# Patient Record
Sex: Female | Born: 1970 | Race: White | Hispanic: No | Marital: Single | Smoking: Never smoker
Health system: Southern US, Community
[De-identification: ages and names within clinical notes are randomized; demographics above are authoritative.]

## PROBLEM LIST (undated history)

## (undated) DIAGNOSIS — Z87442 Personal history of urinary calculi: Secondary | ICD-10-CM

## (undated) DIAGNOSIS — M1990 Unspecified osteoarthritis, unspecified site: Secondary | ICD-10-CM

## (undated) DIAGNOSIS — J45909 Unspecified asthma, uncomplicated: Secondary | ICD-10-CM

## (undated) DIAGNOSIS — Z8669 Personal history of other diseases of the nervous system and sense organs: Secondary | ICD-10-CM

## (undated) DIAGNOSIS — F32A Depression, unspecified: Secondary | ICD-10-CM

## (undated) DIAGNOSIS — R519 Headache, unspecified: Secondary | ICD-10-CM

## (undated) DIAGNOSIS — I10 Essential (primary) hypertension: Secondary | ICD-10-CM

## (undated) DIAGNOSIS — F419 Anxiety disorder, unspecified: Secondary | ICD-10-CM

## (undated) DIAGNOSIS — E039 Hypothyroidism, unspecified: Secondary | ICD-10-CM

## (undated) DIAGNOSIS — C541 Malignant neoplasm of endometrium: Secondary | ICD-10-CM

## (undated) DIAGNOSIS — E119 Type 2 diabetes mellitus without complications: Secondary | ICD-10-CM

## (undated) DIAGNOSIS — L309 Dermatitis, unspecified: Secondary | ICD-10-CM

## (undated) DIAGNOSIS — G4733 Obstructive sleep apnea (adult) (pediatric): Secondary | ICD-10-CM

## (undated) HISTORY — PX: HAND SURGERY: SHX662

## (undated) HISTORY — DX: Obstructive sleep apnea (adult) (pediatric): G47.33

## (undated) HISTORY — PX: URETEROSCOPY WITH HOLMIUM LASER LITHOTRIPSY: SHX6645

## (undated) HISTORY — DX: Dermatitis, unspecified: L30.9

## (undated) HISTORY — PX: FOOT SURGERY: SHX648

## (undated) HISTORY — DX: Type 2 diabetes mellitus without complications: E11.9

## (undated) HISTORY — DX: Malignant neoplasm of endometrium: C54.1

---

## 1997-08-14 ENCOUNTER — Ambulatory Visit: Admission: RE | Admit: 1997-08-14 | Discharge: 1997-08-14 | Payer: Self-pay | Admitting: Family Medicine

## 1999-01-14 HISTORY — PX: LITHOTRIPSY: SUR834

## 1999-12-18 ENCOUNTER — Encounter: Payer: Self-pay | Admitting: Family Medicine

## 1999-12-18 ENCOUNTER — Ambulatory Visit (HOSPITAL_COMMUNITY): Admission: RE | Admit: 1999-12-18 | Discharge: 1999-12-18 | Payer: Self-pay | Admitting: *Deleted

## 1999-12-19 ENCOUNTER — Encounter: Payer: Self-pay | Admitting: Urology

## 1999-12-19 ENCOUNTER — Ambulatory Visit (HOSPITAL_COMMUNITY): Admission: RE | Admit: 1999-12-19 | Discharge: 1999-12-19 | Payer: Self-pay | Admitting: Urology

## 2000-01-14 HISTORY — PX: LASIK: SHX215

## 2000-01-14 HISTORY — PX: WISDOM TOOTH EXTRACTION: SHX21

## 2001-12-06 ENCOUNTER — Encounter: Payer: Self-pay | Admitting: Urology

## 2001-12-06 ENCOUNTER — Encounter: Admission: RE | Admit: 2001-12-06 | Discharge: 2001-12-06 | Payer: Self-pay | Admitting: Urology

## 2004-02-15 ENCOUNTER — Other Ambulatory Visit: Admission: RE | Admit: 2004-02-15 | Discharge: 2004-02-15 | Payer: Self-pay | Admitting: Obstetrics and Gynecology

## 2005-03-09 ENCOUNTER — Emergency Department (HOSPITAL_COMMUNITY): Admission: EM | Admit: 2005-03-09 | Discharge: 2005-03-10 | Payer: Self-pay | Admitting: Emergency Medicine

## 2005-10-30 ENCOUNTER — Ambulatory Visit (HOSPITAL_BASED_OUTPATIENT_CLINIC_OR_DEPARTMENT_OTHER): Admission: RE | Admit: 2005-10-30 | Discharge: 2005-10-30 | Payer: Self-pay | Admitting: Urology

## 2005-11-20 ENCOUNTER — Ambulatory Visit (HOSPITAL_BASED_OUTPATIENT_CLINIC_OR_DEPARTMENT_OTHER): Admission: RE | Admit: 2005-11-20 | Discharge: 2005-11-20 | Payer: Self-pay | Admitting: Urology

## 2007-09-20 ENCOUNTER — Emergency Department (HOSPITAL_COMMUNITY): Admission: EM | Admit: 2007-09-20 | Discharge: 2007-09-20 | Payer: Self-pay | Admitting: Emergency Medicine

## 2009-02-07 ENCOUNTER — Encounter: Admission: RE | Admit: 2009-02-07 | Discharge: 2009-02-07 | Payer: Self-pay | Admitting: Obstetrics and Gynecology

## 2009-08-10 ENCOUNTER — Emergency Department (HOSPITAL_COMMUNITY): Admission: EM | Admit: 2009-08-10 | Discharge: 2009-08-10 | Payer: Self-pay | Admitting: Emergency Medicine

## 2009-11-08 ENCOUNTER — Emergency Department (HOSPITAL_COMMUNITY): Admission: EM | Admit: 2009-11-08 | Discharge: 2009-11-09 | Payer: Self-pay | Admitting: Emergency Medicine

## 2010-02-03 ENCOUNTER — Encounter: Payer: Self-pay | Admitting: Urology

## 2010-03-27 LAB — CBC
HCT: 44.7 % (ref 36.0–46.0)
MCH: 30.2 pg (ref 26.0–34.0)
MCV: 90.5 fL (ref 78.0–100.0)
Platelets: 287 10*3/uL (ref 150–400)
RBC: 4.94 MIL/uL (ref 3.87–5.11)
WBC: 13.3 10*3/uL — ABNORMAL HIGH (ref 4.0–10.5)

## 2010-03-27 LAB — COMPREHENSIVE METABOLIC PANEL
AST: 30 U/L (ref 0–37)
Albumin: 3.9 g/dL (ref 3.5–5.2)
Calcium: 9.1 mg/dL (ref 8.4–10.5)
Creatinine, Ser: 0.94 mg/dL (ref 0.4–1.2)
GFR calc non Af Amer: >60 mL/min (ref >60)

## 2010-03-27 LAB — URINALYSIS, ROUTINE W REFLEX MICROSCOPIC
Glucose, UA: NEGATIVE mg/dL
Nitrite: NEGATIVE
Protein, ur: 30 mg/dL — AB
Specific Gravity, Urine: 1.022 (ref 1.005–1.030)

## 2010-03-27 LAB — DIFFERENTIAL
Lymphocytes Relative: 13 % (ref 12–46)
Lymphs Abs: 1.7 10*3/uL (ref 0.7–4.0)
Monocytes Absolute: 0.9 10*3/uL (ref 0.1–1.0)
Monocytes Relative: 7 % (ref 3–12)
Neutro Abs: 10.2 10*3/uL — ABNORMAL HIGH (ref 1.7–7.7)

## 2010-03-27 LAB — LIPASE, BLOOD: Lipase: 24 U/L (ref 11–59)

## 2010-03-27 LAB — URINE MICROSCOPIC-ADD ON

## 2010-03-27 LAB — POCT PREGNANCY, URINE: Preg Test, Ur: NEGATIVE

## 2010-03-30 LAB — URINE MICROSCOPIC-ADD ON

## 2010-03-30 LAB — URINALYSIS, ROUTINE W REFLEX MICROSCOPIC
Protein, ur: NEGATIVE mg/dL
Urobilinogen, UA: 0.2 mg/dL (ref 0.0–1.0)

## 2010-03-30 LAB — POCT I-STAT, CHEM 8
Calcium, Ion: 1.05 mmol/L — ABNORMAL LOW (ref 1.12–1.32)
Creatinine, Ser: 0.8 mg/dL (ref 0.4–1.2)
Glucose, Bld: 165 mg/dL — ABNORMAL HIGH (ref 70–99)
HCT: 45 % (ref 36.0–46.0)
Hemoglobin: 15.3 g/dL — ABNORMAL HIGH (ref 12.0–15.0)
Potassium: 4.1 mEq/L (ref 3.5–5.1)

## 2010-03-30 LAB — DIFFERENTIAL
Eosinophils Absolute: 0.3 10*3/uL (ref 0.0–0.7)
Eosinophils Relative: 3 % (ref 0–5)
Lymphs Abs: 1.8 10*3/uL (ref 0.7–4.0)
Monocytes Relative: 7 % (ref 3–12)
Neutro Abs: 7.3 10*3/uL (ref 1.7–7.7)

## 2010-03-30 LAB — CBC
HCT: 42.9 % (ref 36.0–46.0)
Hemoglobin: 14.6 g/dL (ref 12.0–15.0)
MCH: 31.2 pg (ref 26.0–34.0)
MCHC: 33.9 g/dL (ref 30.0–36.0)
MCV: 91.9 fL (ref 78.0–100.0)
RDW: 13.6 % (ref 11.5–15.5)

## 2010-05-31 NOTE — Op Note (Signed)
Amanda Spence, WHAN NO.:  [id number]   MEDICAL RECORD NO.:  [id number]          PATIENT TYPE:  AMB   LOCATION:  [facility]                         FACILITY:  [facility]   PHYSICIAN:  Claudette Laws, M.D.  DATE OF BIRTH:  Jan 13, 1971   DATE OF PROCEDURE:  DATE OF DISCHARGE:                                 OPERATIVE REPORT   PREOPERATIVE DIAGNOSIS:  Right ureteral stone.   POSTOPERATIVE DIAGNOSIS:  Right ureteral stone.   PROCEDURE:  Cystoscopy with right ureteroscopic stone manipulation.   SURGEON:  Claudette Laws, M.D.   ASSISTANT:  Terie Purser, M.D.   ANESTHESIA:  General endotracheal   COMPLICATIONS:  None.   ESTIMATED BLOOD LOSS:  Minimal.   DISPOSITION:  Stable to PACU.   INDICATION FOR PROCEDURE:  Ms. Redel is a 40 year old female with history  of kidney stones.  She recently underwent right ureteroscopic stone  manipulation.  She had a significant stone burden in her distal ureter and  was not able to be completed in one setting.  She had a stent in place and  follows up today for the second procedure.  She has been doing well since  her previous surgery.  Full benefits and risks were discussed and consent  was obtained.   DESCRIPTION OF PROCEDURE:  The patient was brought to the operating room and  properly identified.  A time-out was performed to confirm the correct  patient, procedure, and side.  She was administered general anesthesia,  given preoperative antibiotics, placed in the dorsal lithotomy position, and  prepped and draped in a sterile fashion.   We then performed cystoscopy using 12 and 70 degree lens and 22-French  sheath.  There were no mucosal abnormalities or tumors noted in the bladder.  There was a stent emanating from the right UO.  The stent was grasped with  flexible graspers and brought to the level of the meatus.  A tension wire  was passed over the stent to the level of the renal pelvis confirmed with  fluoroscopy.  The  stent was removed.  We then placed a semi-rigid  ureteroscope in the right ureter.  Three significant stones were encountered  in the distal ureter.  These were unable to be removed with a triceps  grasper and were then fragmented using the holmium laser at settings of 0.6  and 0.8.  The stones were broken into multiple small fragments.  These were  then removed with the tricep graspers.  When this was completed, the semi-  rigid ureteroscope was advanced to the level of the renal pelvis.  There was  no significant mucosal abnormality or other significant stone noted in the  ureter.  The scope was then removed and a 6-French 24 cm double-J ureteral  stent was placed using cystoscopic and fluoroscopic guidance.  The bladder  was then emptied  and stone fragments were removed from the bladder.  The patient was then  awakened from anesthesia and transported to the recovery room in stable  condition.  There were no complications.  Dr. Etta Grandchild was present and  participated in all aspects  of this procedure.     ______________________________  Terie Purser, MD      Claudette Laws, M.D.  Electronically Signed    JH/MEDQ  D:  11/20/2005  T:  11/20/2005  Job:  [id number]

## 2010-05-31 NOTE — Op Note (Signed)
[name], Amanda NO.:  [id number]   MEDICAL RECORD NO.:  [id number]          PATIENT TYPE:  AMB   LOCATION:  [facility]                         FACILITY:  [facility]   PHYSICIAN:  Claudette Laws, M.D.  DATE OF BIRTH:  12-03-1970   DATE OF PROCEDURE:  10/30/2005  DATE OF DISCHARGE:                                 OPERATIVE REPORT   PREOPERATIVE DIAGNOSIS:  Right ureteral stones.   POSTOPERATIVE DIAGNOSIS:  Right ureteral stones.   PROCEDURE:  1. Cystoscopy.  2. Staged right ureteroscopic stone manipulation.   SURGEON:  Claudette Laws, M.D.   ASSISTANT:  Terie Purser, M.D.   ANESTHESIA:  General endotracheal.   SPECIMENS:  Stones fragments for analysis.   ESTIMATED BLOOD LOSS:  Minimal.   COMPLICATIONS:  None.   DRAINS:  6-French 26 cm double-J ureteral stent.   DISPOSITION:  To the PACU in stable condition.   INDICATIONS:  [name] is a 40 year old female with a history of kidney  stones.  She has had symptomatic right pain.  She has had imaging which  shows distal right ureteral stones.  There are approximately three stones in  the distal right ureter measuring approximately 0.5 cm to approximately 1  cm.  The benefits and risks of ureteroscopic stone manipulation were  discussed and consent was obtained.   DESCRIPTION OF PROCEDURE:  The patient was brought to the operating room and  properly identified.  A time-out was performed to confirm correct patient  procedure and side.  She was administered general anesthesia, given  preoperative antibiotics, placed in the dorsal lithotomy position, prepped  and draped in a sterile fashion.  Cystoscopy was easily performed using 12  and 70 degree lens and 22-French cystoscope sheath.  There was no evidence  of a stone in the bladder.  Both ureteral orifices were in their normal  anatomic position.  Both seen to be effluxing clear urine.  There was no  evidence of bladder tumor or other mucosal abnormalities.   We then  manipulated a Glidewire in the right ureteral orifice to the level of the  kidney.  The cystoscope was removed and the semi-rigid ureteroscope was  advanced in the ureter.  We encountered the three stones in the distal third  of the ureter.  We then performed stone manipulation using the laser at  settings of 0.6 and 0.8. The lead stone was fragmented into multiple small  pieces.  The stone was quite hard and fragmenting took a fair amount of  time.  We then pulled the stone fragments out of the ureter using a triceps  grasper.  Due to time constraints and the duration of the procedure, the  decision was made to terminate prior to fragmenting the upper two stones.  As a result, a 6-French 26 cm double-J ureteral stent was placed under  cystoscopic and fluoroscopic guidance.  A good curl was noted in the pelvis  and the kidney.  The stone fragments were evacuated from the bladder and  sent to pathology for analysis.  The patient's bladder was then emptied and  the cystoscope was  removed.  The procedure was terminated.  There were no  complications.  Note that Dr. Etta Grandchild was present and participated in all  aspects of this procedure.   DISPOSITION:  The patient will return in approximately two weeks for a  second stage procedure for removal of her remaining stones.     ______________________________  Terie Purser, MD      Claudette Laws, M.D.  Electronically Signed    JH/MEDQ  D:  10/30/2005  T:  11/01/2005  Job:  [id number]

## 2010-10-16 LAB — URINALYSIS, ROUTINE W REFLEX MICROSCOPIC
Bilirubin Urine: NEGATIVE
Glucose, UA: NEGATIVE
Ketones, ur: NEGATIVE
Nitrite: NEGATIVE
Specific Gravity, Urine: 1.025
pH: 5.5

## 2010-10-16 LAB — POCT I-STAT, CHEM 8
BUN: 14
Chloride: 109
Creatinine, Ser: 1.1
Hemoglobin: 15.3 — ABNORMAL HIGH
Potassium: 3.9
TCO2: 22

## 2010-10-16 LAB — CBC
HCT: 43.1
Hemoglobin: 14.6
MCHC: 33.9
RBC: 4.72
RDW: 13.4

## 2010-10-16 LAB — URINE MICROSCOPIC-ADD ON

## 2010-10-16 LAB — DIFFERENTIAL
Lymphocytes Relative: 10 — ABNORMAL LOW
Lymphs Abs: 1.3
Neutrophils Relative %: 81 — ABNORMAL HIGH

## 2010-10-16 LAB — POCT PREGNANCY, URINE: Preg Test, Ur: NEGATIVE

## 2014-01-24 ENCOUNTER — Ambulatory Visit
Admission: RE | Admit: 2014-01-24 | Discharge: 2014-01-24 | Disposition: A | Discharge status: Home / Self Care | Payer: BLUE CROSS/BLUE SHIELD | Source: Ambulatory Visit | Attending: Family Medicine | Admitting: Family Medicine

## 2014-01-24 ENCOUNTER — Other Ambulatory Visit: Payer: Self-pay | Admitting: Family Medicine

## 2014-01-24 DIAGNOSIS — M79604 Pain in right leg: Secondary | ICD-10-CM

## 2014-01-24 DIAGNOSIS — M7989 Other specified soft tissue disorders: Secondary | ICD-10-CM

## 2015-02-14 ENCOUNTER — Other Ambulatory Visit: Payer: Self-pay

## 2015-02-14 DIAGNOSIS — Z1231 Encounter for screening mammogram for malignant neoplasm of breast: Secondary | ICD-10-CM

## 2015-03-01 ENCOUNTER — Ambulatory Visit
Admission: RE | Admit: 2015-03-01 | Discharge: 2015-03-01 | Disposition: A | Discharge status: Home / Self Care | Payer: BLUE CROSS/BLUE SHIELD | Source: Ambulatory Visit

## 2015-03-01 DIAGNOSIS — Z1231 Encounter for screening mammogram for malignant neoplasm of breast: Secondary | ICD-10-CM

## 2016-01-03 ENCOUNTER — Other Ambulatory Visit: Payer: Self-pay | Admitting: Family Medicine

## 2016-01-03 DIAGNOSIS — R109 Unspecified abdominal pain: Secondary | ICD-10-CM

## 2016-01-04 ENCOUNTER — Ambulatory Visit
Admission: RE | Admit: 2016-01-04 | Discharge: 2016-01-04 | Disposition: A | Discharge status: Home / Self Care | Payer: No Typology Code available for payment source | Source: Ambulatory Visit | Attending: Family Medicine | Admitting: Family Medicine

## 2016-01-04 DIAGNOSIS — R109 Unspecified abdominal pain: Secondary | ICD-10-CM

## 2016-01-10 ENCOUNTER — Other Ambulatory Visit: Payer: Self-pay | Admitting: Urology

## 2016-01-11 ENCOUNTER — Encounter (HOSPITAL_COMMUNITY): Payer: Self-pay | Admitting: *Deleted

## 2016-01-16 NOTE — H&P (Signed)
CC/HPI: I was consulted by the above provider. The patient came in today to be assessed for left renal stone. She is followed by Dr. Algis Downs for kidney stones. She's had lithotripsy and ureteroscopy. She may have some left flank pain off and on and she passed a stone in August. In the last few weeks she's been having left-sided pain that sometimes radiates to the left lower quadrant almost daily. She has had no frequency or dysuria or cystitis symptoms.   At baseline she voids every 2-3 hours and gets up once a night. She stopped her daily aspirin. She is prediabetic   She was found to have 11 x 7 mm stone in the left renal pelvis nonobstructing and bilateral nonobstructing stones otherwise.   There is no other aggravating or relieving factors  There is no other associated signs and symptoms  The severity of the symptoms is moderate  The symptoms are ongoing and bothersome     ALLERGIES: Biaxin TABS PredniSONE TABS    MEDICATIONS: Advair Diskus 250-50 MCG/DOSE Inhalation Aerosol Powder Breath Activated Inhalation  Advil TABS Oral  ProAir HFA AERS Inhalation  Promethazine HCl - 25 MG Oral Tablet 6 Oral  Sprix 15.75 MG/SPRAY Nasal Solution 1 Nasal  Synthroid 150 MCG Oral Tablet Oral  Tramadol Hcl 50 mg tablet  Tylenol TABS Oral     GU PSH: Cystoscopy Ureteroscopy - 2011 Renal ESWL - 2011      PSH Notes: Corneal LASIK, Cystoscopy With Ureteroscopy, Lithotripsy   NON-GU PSH: None   GU PMH: Calculus Ureter, Ureteral Stone - 2014, Calculus of left ureter, - 2014 Flank Pain, Generalized abdominal pain - 2014 Kidney Stone, Bilateral kidney stones - 2014 Personal Hx urinary calculi, Nephrolithiasis - 2014      PMH Notes:  1898-01-13 00:00:00 - Note: Normal Routine History And Physical Adult   NON-GU PMH: Anxiety, Anxiety (Symptom) - 2014 Asthma, Asthma - 2014 Hypothyroidism, Primary Hypothyroidism - 2014 Personal history of other mental and behavioral disorders, History of depression -  2014    FAMILY HISTORY: Chronic Obstructive Pulmonary Disease - Sister Congestive Heart Failure - Mother Epilepsies - Mother nephrolithiasis - Sister   SOCIAL HISTORY: None    Notes: Never A Smoker, Alcohol Use, Caffeine Use, Occupation:, Tobacco Use, Marital History - Single   REVIEW OF SYSTEMS:    GU Review Female:   Patient denies frequent urination, hard to postpone urination, burning /pain with urination, get up at night to urinate, leakage of urine, stream starts and stops, trouble starting your stream, have to strain to urinate, and currently pregnant.  Gastrointestinal (Upper):   Patient denies nausea, vomiting, and indigestion/ heartburn.  Gastrointestinal (Lower):   Patient denies diarrhea and constipation.  Constitutional:   Patient denies fever, night sweats, weight loss, and fatigue.  Skin:   Patient denies skin rash/ lesion and itching.  Eyes:   Patient denies blurred vision and double vision.  Ears/ Nose/ Throat:   Patient denies sore throat and sinus problems.  Hematologic/Lymphatic:   Patient denies swollen glands and easy bruising.  Cardiovascular:   Patient denies leg swelling and chest pains.  Respiratory:   Patient denies cough and shortness of breath.  Endocrine:   Patient denies excessive thirst.  Musculoskeletal:   Patient denies back pain and joint pain.  Neurological:   Patient denies headaches and dizziness.  Psychologic:   Patient denies depression and anxiety.   VITAL SIGNS:      01/09/2016 12:55 PM  Weight 240 lb / 108.86  kg  BP 138/84 mmHg  Pulse 85 /min  Temperature 97.9 F / 37 C   GU PHYSICAL EXAMINATION:    Bladder: The patient is nontoxic and noted no bladder or CVA tenderness   MULTI-SYSTEM PHYSICAL EXAMINATION:    Constitutional: Well-nourished. No physical deformities. Normally developed. Good grooming.  Neck: Neck symmetrical, not swollen. Normal tracheal position.  Respiratory: No labored breathing, no use of accessory muscles.    Cardiovascular: Normal temperature, normal extremity pulses, no swelling, no varicosities.  Lymphatic: No enlargement of neck, axillae, groin.  Skin: No paleness, no jaundice, no cyanosis. No lesion, no ulcer, no rash.  Neurologic / Psychiatric: Oriented to time, oriented to place, oriented to person. No depression, no anxiety, no agitation.  Gastrointestinal: No mass, no tenderness, no rigidity, non obese abdomen.  Eyes: Normal conjunctivae. Normal eyelids.  Ears, Nose, Mouth, and Throat: Left ear no scars, no lesions, no masses. Right ear no scars, no lesions, no masses. Nose no scars, no lesions, no masses. Normal hearing. Normal lips.  Musculoskeletal: Normal gait and station of head and neck.     PAST DATA REVIEWED:  Source Of History:  Patient   PROCEDURES:         KUB - 74000  Renal shadows were normal. She had easy to see stones in each kidney with a large egg-shaped stone in the left renal pelvis. I did not see distal stones. Gas pattern was normal. There was no bony abnormalities               Urinalysis w/Scope Dipstick Dipstick Cont'd Micro  Color: Amber Bilirubin: Neg WBC/hpf: 0 - 5/hpf  Appearance: Cloudy Ketones: Neg RBC/hpf: >60/hpf  Specific Gravity: 1.030 Blood: 3+ Bacteria: Few (10-25/hpf)  pH: 6.0 Protein: 1+ Cystals: NS (Not Seen)  Glucose: Neg Urobilinogen: 0.2 Casts: NS (Not Seen)    Nitrites: Neg Trichomonas: Not Present    Leukocyte Esterase: Neg Mucous: Present      Epithelial Cells: 0 - 5/hpf      Yeast: NS (Not Seen)      Sperm: Not Present    ASSESSMENT:      ICD-10 Details  1 GU:   Calculus Ureter - N20.1   2   Urinary Frequency - R35.0           Notes:   The patient came in today to be assessed for left renal stone. She is followed by Dr. Algis Downs for kidney stones. She's had lithotripsy and ureteroscopy. She may have some left flank pain off and on and she passed a stone in August. In the last few weeks she's been having left-sided pain that sometimes  radiates to the left lower quadrant almost daily. She has had no frequency or dysuria or cystitis symptoms.   At baseline she voids every 2-3 hours and gets up once a night. She stopped her daily aspirin. She is prediabetic   The patient is nontoxic and noted no bladder or CVA tenderness   The urine had bacteriuria and it was sent for culture   I reviewed her CT scan an KUB and she does have a large stone easily visualized at the left ureteropelvic junction   The stone was not obstructing when the CT scan was done but one could see how she could be having a ball valve effect. I drew her a picture. I talked her about lithotripsy in detail versus watchful waiting   We talked about ESWL in detail. Pros, cons, general surgical and anesthetic  risks, and other options including watchful waiting and ureteroscopy were discussed. Success and failure rates and need for further/repeat therapy were discussed. Risks were described but not limited to pain, infection, sepsis, and bleeding. The risk of renal and ureteral trauma with short and long term sequelae were discussed. The risk of injury to adjacent structures was discussed. The risk of needing a stent post-ESWL was discussed.   The patient understands the ball valve affect. She like to schedule lithotripsy. I will send a note to the above provider   After a thorough review of the management options for the patient's condition the patient  elected to proceed with surgical therapy as noted above. We have discussed the potential benefits and risks of the procedure, side effects of the proposed treatment, the likelihood of the patient achieving the goals of the procedure, and any potential problems that might occur during the procedure or recuperation. Informed consent has been obtained.

## 2016-01-17 ENCOUNTER — Ambulatory Visit (HOSPITAL_COMMUNITY)
Admission: RE | Admit: 2016-01-17 | Discharge: 2016-01-17 | Disposition: A | Discharge status: Home / Self Care | Payer: No Typology Code available for payment source | Source: Ambulatory Visit | Attending: Urology | Admitting: Urology

## 2016-01-17 ENCOUNTER — Encounter (HOSPITAL_COMMUNITY): Payer: Self-pay | Admitting: *Deleted

## 2016-01-17 ENCOUNTER — Encounter (HOSPITAL_COMMUNITY)
Admission: RE | Disposition: A | Discharge status: Home / Self Care | Payer: Self-pay | Source: Ambulatory Visit | Attending: Urology

## 2016-01-17 ENCOUNTER — Ambulatory Visit (HOSPITAL_COMMUNITY): Payer: No Typology Code available for payment source

## 2016-01-17 DIAGNOSIS — Z841 Family history of disorders of kidney and ureter: Secondary | ICD-10-CM | POA: Insufficient documentation

## 2016-01-17 DIAGNOSIS — R7303 Prediabetes: Secondary | ICD-10-CM | POA: Insufficient documentation

## 2016-01-17 DIAGNOSIS — Z79899 Other long term (current) drug therapy: Secondary | ICD-10-CM | POA: Insufficient documentation

## 2016-01-17 DIAGNOSIS — Z7951 Long term (current) use of inhaled steroids: Secondary | ICD-10-CM | POA: Insufficient documentation

## 2016-01-17 DIAGNOSIS — E669 Obesity, unspecified: Secondary | ICD-10-CM | POA: Insufficient documentation

## 2016-01-17 DIAGNOSIS — E039 Hypothyroidism, unspecified: Secondary | ICD-10-CM | POA: Insufficient documentation

## 2016-01-17 DIAGNOSIS — N200 Calculus of kidney: Secondary | ICD-10-CM | POA: Diagnosis not present

## 2016-01-17 DIAGNOSIS — Z791 Long term (current) use of non-steroidal anti-inflammatories (NSAID): Secondary | ICD-10-CM | POA: Diagnosis not present

## 2016-01-17 DIAGNOSIS — J45909 Unspecified asthma, uncomplicated: Secondary | ICD-10-CM | POA: Diagnosis not present

## 2016-01-17 HISTORY — DX: Unspecified osteoarthritis, unspecified site: M19.90

## 2016-01-17 HISTORY — DX: Hypothyroidism, unspecified: E03.9

## 2016-01-17 HISTORY — DX: Unspecified asthma, uncomplicated: J45.909

## 2016-01-17 HISTORY — DX: Anxiety disorder, unspecified: F41.9

## 2016-01-17 HISTORY — DX: Personal history of urinary calculi: Z87.442

## 2016-01-17 HISTORY — PX: EXTRACORPOREAL SHOCK WAVE LITHOTRIPSY: SHX1557

## 2016-01-17 LAB — HCG, SERUM, QUALITATIVE: Preg, Serum: NEGATIVE

## 2016-01-17 SURGERY — LITHOTRIPSY, ESWL
Anesthesia: LOCAL | Laterality: Left

## 2016-01-17 MED ORDER — DIPHENHYDRAMINE HCL 25 MG PO CAPS
25.0000 mg | ORAL_CAPSULE | ORAL | Status: AC
  Administered 2016-01-17: 25 mg via ORAL
  Filled 2016-01-17: qty 1

## 2016-01-17 MED ORDER — CIPROFLOXACIN HCL 500 MG PO TABS
500.0000 mg | ORAL_TABLET | ORAL | Status: AC
  Administered 2016-01-17: 500 mg via ORAL
  Filled 2016-01-17: qty 1

## 2016-01-17 MED ORDER — DIAZEPAM 5 MG PO TABS
10.0000 mg | ORAL_TABLET | ORAL | Status: AC
  Administered 2016-01-17: 10 mg via ORAL
  Filled 2016-01-17: qty 2

## 2016-01-17 MED ORDER — SODIUM CHLORIDE 0.9 % IV SOLN
INTRAVENOUS | Status: DC
  Administered 2016-01-17: 10:00:00 via INTRAVENOUS

## 2016-01-17 NOTE — Discharge Instructions (Signed)
I have reviewed discharge instructions in detail with the patient. They will follow-up with me or their physician as scheduled. My nurse will also be calling the patients as per protocol.  ° °Moderate Conscious Sedation, Adult, Care After °These instructions provide you with information about caring for yourself after your procedure. Your health care provider may also give you more specific instructions. Your treatment has been planned according to current medical practices, but problems sometimes occur. Call your health care provider if you have any problems or questions after your procedure. °What can I expect after the procedure? °After your procedure, it is common: °· To feel sleepy for several hours. °· To feel clumsy and have poor balance for several hours. °· To have poor judgment for several hours. °· To vomit if you eat too soon. °Follow these instructions at home: °For at least 24 hours after the procedure:  °· Do not: °¨ Participate in activities where you could fall or become injured. °¨ Drive. °¨ Use heavy machinery. °¨ Drink alcohol. °¨ Take sleeping pills or medicines that cause drowsiness. °¨ Make important decisions or sign legal documents. °¨ Take care of children on your own. °· Rest. °Eating and drinking °· Follow the diet recommended by your health care provider. °· If you vomit: °¨ Drink water, juice, or soup when you can drink without vomiting. °¨ Make sure you have little or no nausea before eating solid foods. °General instructions °· Have a responsible adult stay with you until you are awake and alert. °· Take over-the-counter and prescription medicines only as told by your health care provider. °· If you smoke, do not smoke without supervision. °· Keep all follow-up visits as told by your health care provider. This is important. °Contact a health care provider if: °· You keep feeling nauseous or you keep vomiting. °· You feel light-headed. °· You develop a rash. °· You have a fever. °Get  help right away if: °· You have trouble breathing. °This information is not intended to replace advice given to you by your health care provider. Make sure you discuss any questions you have with your health care provider. °Document Released: 10/20/2012 Document Revised: 06/04/2015 Document Reviewed: 04/21/2015 °Elsevier Interactive Patient Education © 2017 [employer] ° °

## 2016-01-17 NOTE — Interval H&P Note (Signed)
History and Physical Interval Note:  01/17/2016 9:21 AM  Amanda Spence  has presented today for surgery, with the diagnosis of LEFT RENAL STONE  The various methods of treatment have been discussed with the patient and family. After consideration of risks, benefits and other options for treatment, the patient has consented to  Procedure(s): EXTRACORPOREAL SHOCK WAVE LITHOTRIPSY (ESWL) (Left) as a surgical intervention .  The patient's history has been reviewed, patient examined, no change in status, stable for surgery.  I have reviewed the patient's chart and labs.  Questions were answered to the patient's satisfaction.     Aayansh Codispoti A

## 2016-03-27 ENCOUNTER — Other Ambulatory Visit: Payer: Self-pay | Admitting: Physician Assistant

## 2016-03-27 DIAGNOSIS — S301XXA Contusion of abdominal wall, initial encounter: Secondary | ICD-10-CM

## 2016-04-04 ENCOUNTER — Encounter (HOSPITAL_COMMUNITY): Payer: Self-pay | Admitting: Urology

## 2016-10-16 ENCOUNTER — Other Ambulatory Visit: Payer: Self-pay | Admitting: Physician Assistant

## 2016-10-16 DIAGNOSIS — Z1231 Encounter for screening mammogram for malignant neoplasm of breast: Secondary | ICD-10-CM

## 2016-10-23 ENCOUNTER — Ambulatory Visit
Admission: RE | Admit: 2016-10-23 | Discharge: 2016-10-23 | Disposition: A | Discharge status: Home / Self Care | Payer: No Typology Code available for payment source | Source: Ambulatory Visit | Attending: Physician Assistant | Admitting: Physician Assistant

## 2016-10-23 DIAGNOSIS — Z1231 Encounter for screening mammogram for malignant neoplasm of breast: Secondary | ICD-10-CM

## 2020-02-24 ENCOUNTER — Other Ambulatory Visit: Payer: Self-pay | Admitting: Sports Medicine

## 2020-02-24 DIAGNOSIS — M25521 Pain in right elbow: Secondary | ICD-10-CM

## 2020-03-14 ENCOUNTER — Other Ambulatory Visit: Payer: Self-pay

## 2020-03-14 ENCOUNTER — Ambulatory Visit
Admission: RE | Admit: 2020-03-14 | Discharge: 2020-03-14 | Disposition: A | Discharge status: Home / Self Care | Payer: No Typology Code available for payment source | Source: Ambulatory Visit | Attending: Sports Medicine | Admitting: Sports Medicine

## 2020-03-14 DIAGNOSIS — M25521 Pain in right elbow: Secondary | ICD-10-CM

## 2020-12-25 ENCOUNTER — Other Ambulatory Visit: Payer: Self-pay | Admitting: Sports Medicine

## 2020-12-25 ENCOUNTER — Ambulatory Visit
Admission: RE | Admit: 2020-12-25 | Discharge: 2020-12-25 | Disposition: A | Discharge status: Home / Self Care | Payer: Self-pay | Source: Ambulatory Visit | Attending: Sports Medicine | Admitting: Sports Medicine

## 2020-12-25 ENCOUNTER — Other Ambulatory Visit: Payer: Self-pay

## 2020-12-25 DIAGNOSIS — M25562 Pain in left knee: Secondary | ICD-10-CM

## 2020-12-25 DIAGNOSIS — M25561 Pain in right knee: Secondary | ICD-10-CM

## 2022-03-16 ENCOUNTER — Emergency Department (HOSPITAL_BASED_OUTPATIENT_CLINIC_OR_DEPARTMENT_OTHER)
Admission: EM | Admit: 2022-03-16 | Discharge: 2022-03-16 | Disposition: A | Discharge status: Home / Self Care | Payer: Commercial Managed Care - PPO | Attending: Emergency Medicine | Admitting: Emergency Medicine

## 2022-03-16 ENCOUNTER — Emergency Department (HOSPITAL_BASED_OUTPATIENT_CLINIC_OR_DEPARTMENT_OTHER): Payer: Commercial Managed Care - PPO

## 2022-03-16 ENCOUNTER — Encounter (HOSPITAL_BASED_OUTPATIENT_CLINIC_OR_DEPARTMENT_OTHER): Payer: Self-pay | Admitting: Emergency Medicine

## 2022-03-16 ENCOUNTER — Other Ambulatory Visit: Payer: Self-pay

## 2022-03-16 DIAGNOSIS — W010XXA Fall on same level from slipping, tripping and stumbling without subsequent striking against object, initial encounter: Secondary | ICD-10-CM | POA: Insufficient documentation

## 2022-03-16 DIAGNOSIS — M79632 Pain in left forearm: Secondary | ICD-10-CM | POA: Insufficient documentation

## 2022-03-16 DIAGNOSIS — S5012XA Contusion of left forearm, initial encounter: Secondary | ICD-10-CM

## 2022-03-16 DIAGNOSIS — Y9367 Activity, basketball: Secondary | ICD-10-CM | POA: Diagnosis not present

## 2022-03-16 MED ORDER — IBUPROFEN 800 MG PO TABS
800.0000 mg | ORAL_TABLET | Freq: Once | ORAL | Status: AC
  Administered 2022-03-16: 800 mg via ORAL
  Filled 2022-03-16: qty 1

## 2022-03-16 NOTE — Discharge Instructions (Addendum)
Ice to area of pain.  Return if any problems.

## 2022-03-16 NOTE — ED Triage Notes (Signed)
Pt from home with left wrist pain after a fall today while playing ball. Pt reports that she threw the ball and her body went with it. She states her left wrist and forearm hurt, especially when she turns the arm outward. Pt alert & oriented, nad noted.

## 2022-03-16 NOTE — ED Provider Notes (Signed)
[facility] Provider Note   CSN: [id number] Arrival date & time: 03/16/22  1718     History  Chief Complaint  Patient presents with   Wrist Pain    Amanda Spence is a 52 y.o. female.  With a history of arthritis and anxiety who presents to the ED for evaluation of left forearm pain.  She was throwing a basketball in an under hand motion approximately 1 hour prior to arrival when she fell forward.  She landed on bilateral outstretched hands.  Felt immediate pain in the left forearm.  Denies numbness or tingling.  Denies hitting any other part of her body.  Did not hit her head or lose consciousness.  Does not take blood thinners.  Complaining of pain with pronation, supination, elbow flexion and extension.   Wrist Pain       Home Medications Prior to Admission medications   Medication Sig Start Date End Date Taking? Authorizing Provider  albuterol (PROVENTIL HFA;VENTOLIN HFA) 108 (90 Base) MCG/ACT inhaler Inhale 2 puffs into the lungs every 4 (four) hours as needed for wheezing or shortness of breath.    [provider]  cephALEXin (KEFLEX) 500 MG capsule Take 500 mg by mouth 3 (three) times daily.    [provider]  fluticasone furoate-vilanterol (BREO ELLIPTA) 100-25 MCG/INH AEPB Inhale 1 puff into the lungs daily.    [provider]  HYDROcodone-acetaminophen (NORCO/VICODIN) 5-325 MG tablet Take 1 tablet by mouth every 6 (six) hours as needed for moderate pain.    [provider]  levothyroxine (SYNTHROID, LEVOTHROID) 150 MCG tablet Take 150 mcg by mouth daily before breakfast.    [provider]  promethazine (PHENERGAN) 25 MG tablet Take 25 mg by mouth every 6 (six) hours as needed for nausea or vomiting.    [provider]  sertraline (ZOLOFT) 100 MG tablet Take 75 mg by mouth daily.    [provider]  traMADol (ULTRAM) 50 MG tablet Take 50 mg by mouth every 6 (six)  hours as needed.    [provider]      Allergies    Biaxin [clarithromycin]    Review of Systems   Review of Systems  Musculoskeletal:  Positive for arthralgias.  All other systems reviewed and are negative.   Physical Exam Updated Vital Signs BP (!) 148/97 (BP Location: Right Arm)   Pulse 99   Temp 98 F (36.7 C) (Oral)   Resp 16   Ht '5\' 2"'$  (1.575 m)   Wt 122 kg   LMP 12/13/2021 (Approximate)   SpO2 97%   BMI 49.20 kg/m  Physical Exam Vitals and nursing note reviewed.  Constitutional:      General: She is not in acute distress.    Appearance: Normal appearance. She is normal weight. She is not ill-appearing.  HENT:     Head: Normocephalic and atraumatic.  Pulmonary:     Effort: Pulmonary effort is normal. No respiratory distress.  Abdominal:     General: Abdomen is flat.  Musculoskeletal:        General: Normal range of motion.     Cervical back: Neck supple.     Comments: Tenderness to palpation of the ulnar portion of the left wrist and distal forearm.  Full active range of motion of the left wrist and elbow.  Grip strength 5 out of 5 compared contralaterally.  There is anatomical snuffbox tenderness, however patient does have an old bruise overlying the  anatomical snuffbox and states it feels unchanged.  Skin:    General: Skin is warm and dry.     Capillary Refill: Capillary refill takes less than 2 seconds.  Neurological:     General: No focal deficit present.     Mental Status: She is alert and oriented to person, place, and time.  Psychiatric:        Mood and Affect: Mood normal.        Behavior: Behavior normal.     ED Results / Procedures / Treatments   Labs (all labs ordered are listed, but only abnormal results are displayed) Labs Reviewed - No data to display  EKG None  Radiology No results found.  Procedures Procedures    Medications Ordered in ED Medications  ibuprofen (ADVIL) tablet 800 mg (800 mg Oral Given 03/16/22 1802)     ED Course/ Medical Decision Making/ A&P                             Medical Decision Making Amount and/or Complexity of Data Reviewed Radiology: ordered.  Risk Prescription drug management.  This patient presents to the ED for concern of fall, left forearm pain, this involves an extensive number of treatment options, and is a complaint that carries with it a high risk of complications and morbidity.  The differential diagnosis includes fracture, strain, sprain, contusion  My initial workup includes x-ray left forearm, Motrin, ice  Additional history obtained from: Nursing notes from this visit.  I ordered imaging studies including x-ray left forearm I independently visualized and interpreted imaging which showed pending at the time of shift change. I agree with the radiologist interpretation  Patient is a 52 year old, hypertensive but otherwise hemodynamically stable.  52 year old female presenting to the ED for evaluation of left forearm pain after a fall on outstretched hands.  She has some mild tenderness to palpation of the distal ulna.  Neurovascular status intact.  Also some tenderness to palpation of the left thumb, however has a bruise overlying the anatomical snuffbox and feels like her pain is unchanged from prior to the fall. x ray pending at the time of shift change. Care will be handed off to [employer], [place]. If image is negative, patient may benefit from wrist brace vs sling and close follow up with primary care.  Plan may change at the discretion of the oncoming provider.  Note: Portions of this report may have been transcribed using voice recognition software. Every effort was made to ensure accuracy; however, inadvertent computerized transcription errors may still be present.        Final Clinical Impression(s) / ED Diagnoses Final diagnoses:  None    Rx / DC Orders ED Discharge Orders     None         Roylene Reason, Hershal Coria 03/16/22 [place], Ankit, MD 03/19/22 1009

## 2022-03-16 NOTE — ED Provider Notes (Signed)
Patient's care assumed at 7 PM.  X-ray of left forearm pending.  X-ray of left forearm returned and is read by radiologist as negative patient reexamined she is tender in her wrist and hand as well.  Patient offered the option of x-ray of her hand x-ray left hand is read by radiologist as negative patient is placed in a wrist splint she is advised Tylenol or ibuprofen for discomfort patient is advised to follow-up with Dr. Rhona Raider her orthopedist if pain persist past 1 week.   Amanda Spence 03/16/22 2008    Varney Biles, MD 03/19/22 1014

## 2022-05-22 IMAGING — CR DG KNEE 3 VIEWS*L*
3 series · 3 of 3 positions shown · non-contrast
Comparison: None.

CLINICAL DATA: Left knee pain for several months without known
injury.

EXAM:
LEFT KNEE - 3 VIEW

[w knee ap left]
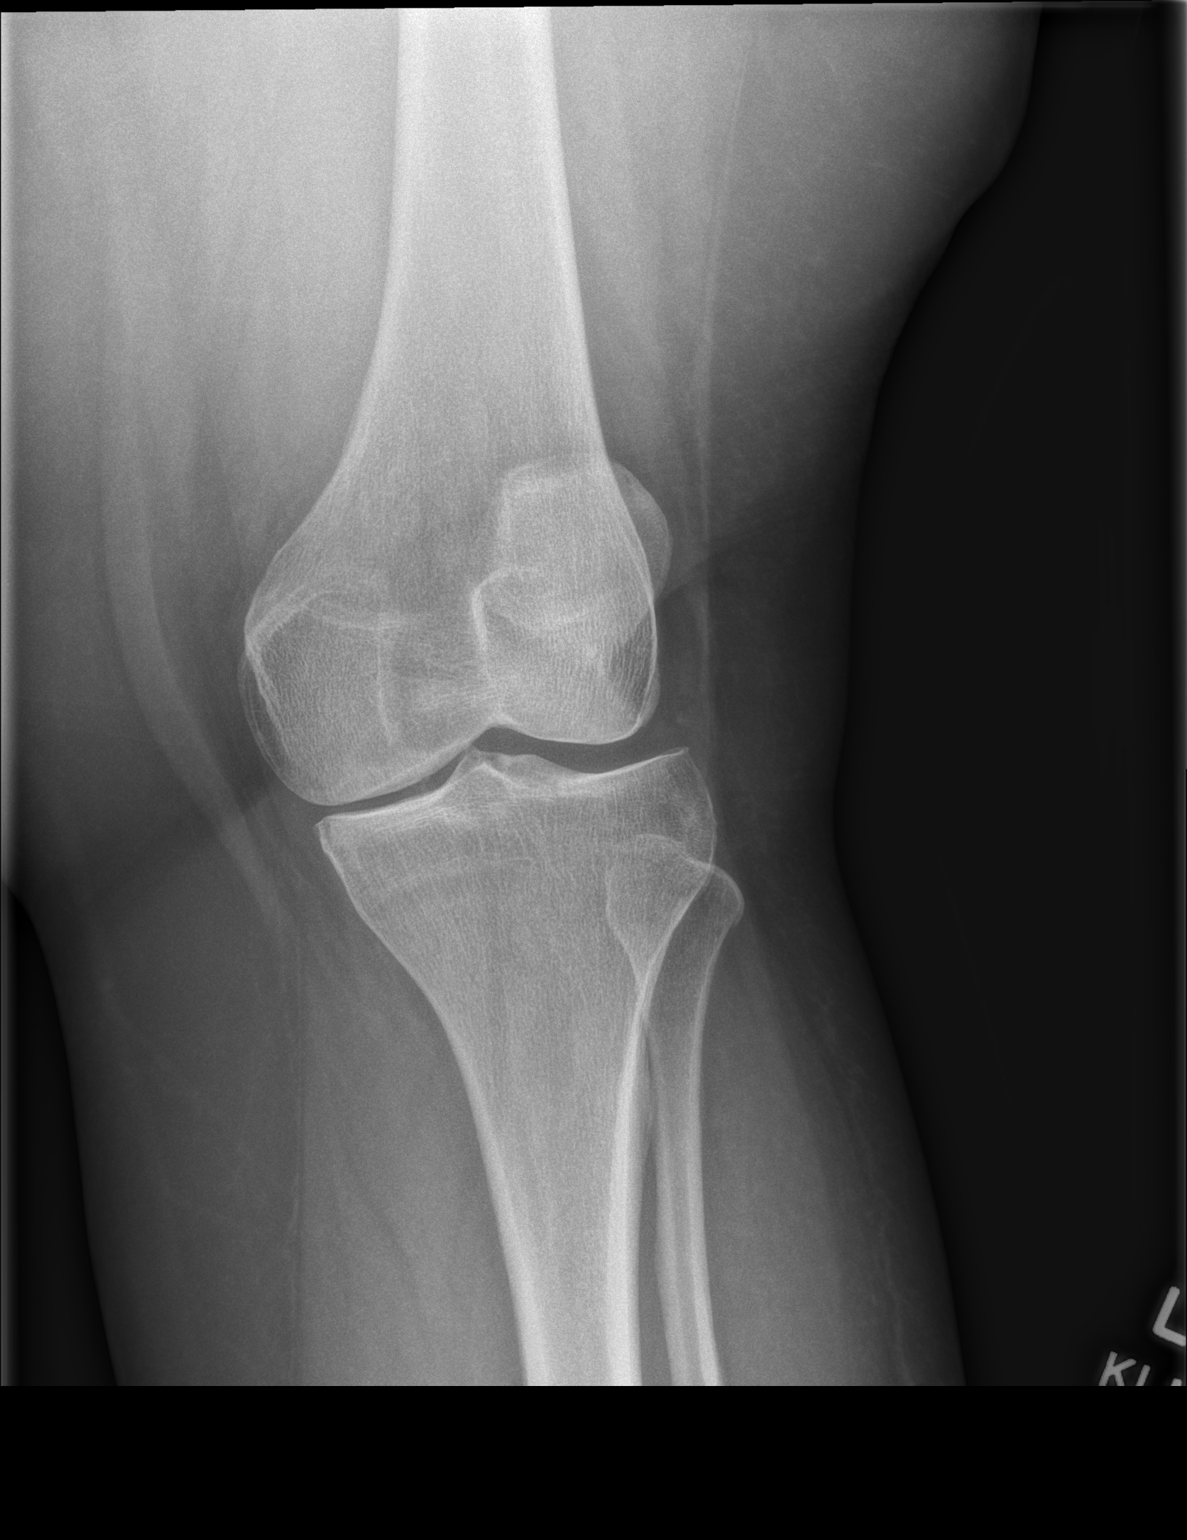

[w knee lat left]
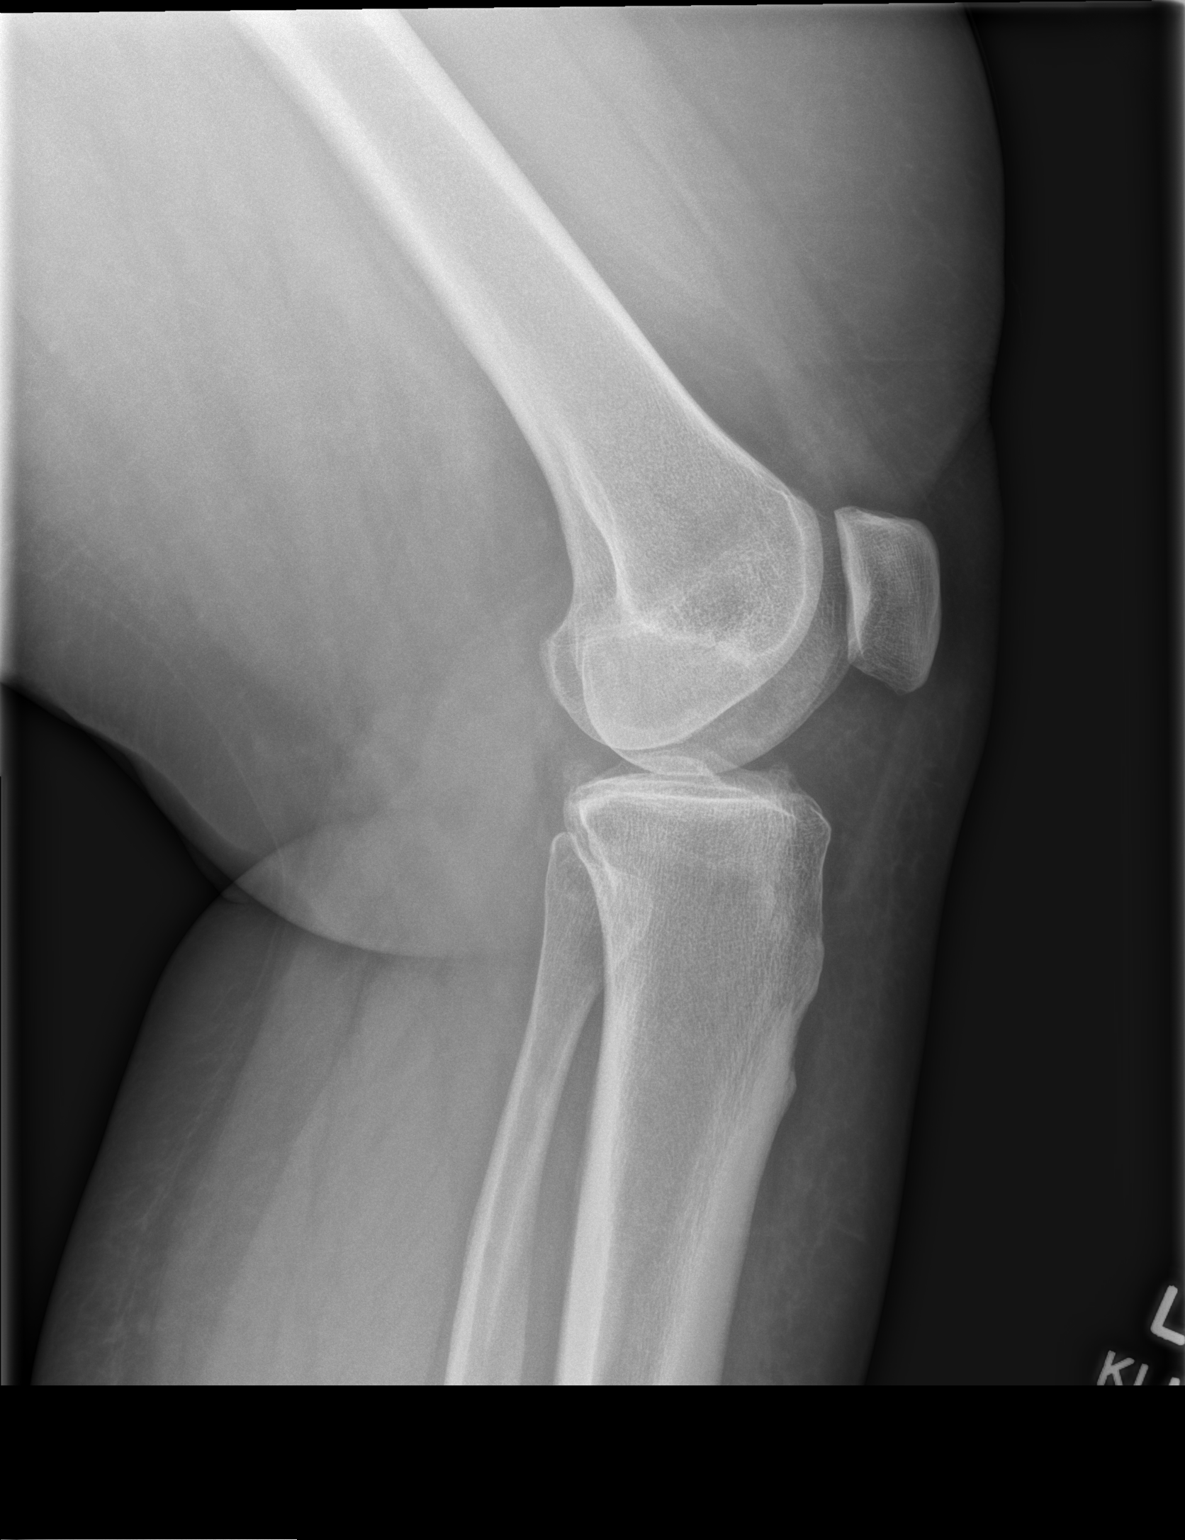

[x knee sunrise left]
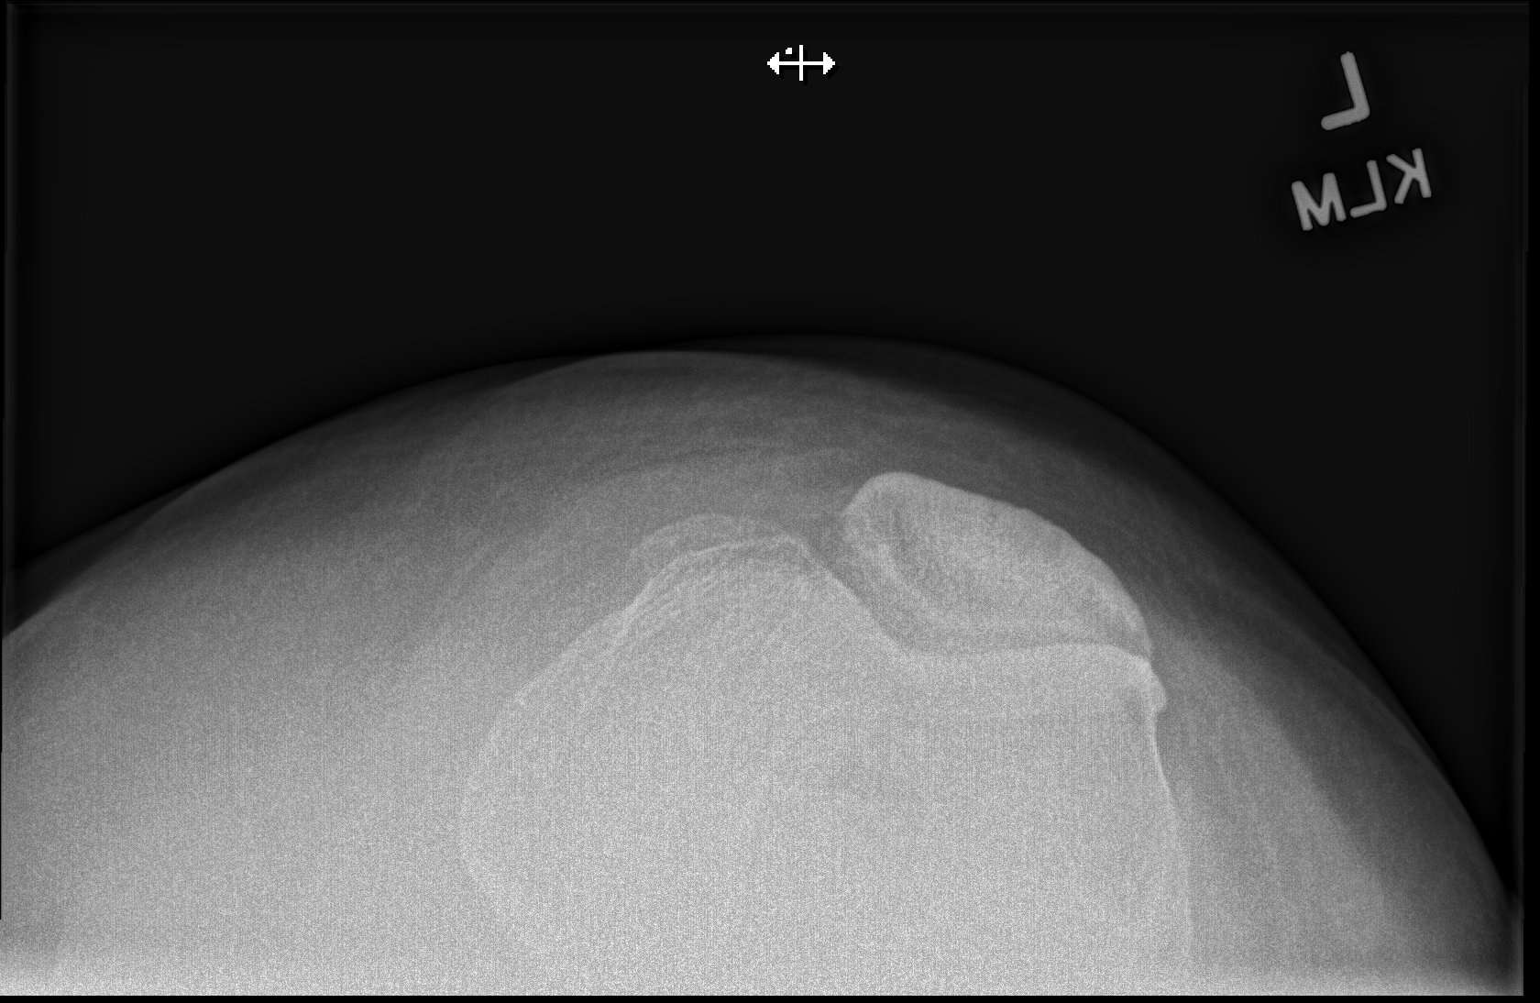

[3 of 3 positions shown; findings below may reference images not displayed]

FINDINGS: No evidence of fracture, dislocation, or joint effusion. Moderate
narrowing of medial joint space is noted. Soft tissues are
unremarkable.
IMPRESSION: Moderate degenerative joint disease is noted medially. No acute
abnormality seen.

## 2022-08-05 ENCOUNTER — Ambulatory Visit: Payer: Commercial Managed Care - PPO | Admitting: Allergy & Immunology

## 2022-08-05 ENCOUNTER — Encounter: Payer: Self-pay | Admitting: Allergy & Immunology

## 2022-08-05 ENCOUNTER — Other Ambulatory Visit: Payer: Self-pay

## 2022-08-05 VITALS — BP 122/78 | HR 94 | Temp 98.1°F | Resp 17 | Ht 62.5 in | Wt 273.1 lb

## 2022-08-05 DIAGNOSIS — T7840XD Allergy, unspecified, subsequent encounter: Secondary | ICD-10-CM

## 2022-08-05 DIAGNOSIS — L272 Dermatitis due to ingested food: Secondary | ICD-10-CM

## 2022-08-05 DIAGNOSIS — J4540 Moderate persistent asthma, uncomplicated: Secondary | ICD-10-CM

## 2022-08-05 DIAGNOSIS — J302 Other seasonal allergic rhinitis: Secondary | ICD-10-CM

## 2022-08-05 DIAGNOSIS — J3089 Other allergic rhinitis: Secondary | ICD-10-CM | POA: Diagnosis not present

## 2022-08-05 NOTE — Patient Instructions (Addendum)
1. Allergic reaction - unknown trigger - Testing was slightly reactive to sesame, casein (component of cows milk), and pineapple. - However, none of these was particularly large. - I would avoid sesame, cows milk, and pineapple for now. - We are going to get an alpha gal panel to see if this might be the cause of your reactions. - We are going to get a tryptase as well to rule out mast cell disease. - We are getting a stinging insect panel as well given your history of the large local reaction from the sting on your foot.  - EpiPen training reviewed. - Anaphylaxis management plan provided.  3. Moderate persistent asthma, uncomplicated - Lung testing not done today. - You seem to have everything under good control. - Daily controller medication(s): Advair 250/34mcg one puff twice daily - Prior to physical activity: albuterol 2 puffs 10-15 minutes before physical activity. - Rescue medications: albuterol 4 puffs every 4-6 hours as needed - Asthma control goals:  * Full participation in all desired activities (may need albuterol before activity) * Albuterol use two time or less a week on average (not counting use with activity) * Cough interfering with sleep two time or less a month * Oral steroids no more than once a year * No hospitalizations  4. Seasonal and perennial allergic rhinitis - Testing today showed: grasses, trees, indoor molds, cat, dog, cockroach, and horse - Copy of test results provided.  - Avoidance measures provided. - Start taking: Xyzal (levocetirizine) 5mg  tablet once daily and Flonase (fluticasone) one spray per nostril daily (AIM FOR EAR ON EACH SIDE) - You can use an extra dose of the antihistamine, if needed, for breakthrough symptoms.  - Consider nasal saline rinses 1-2 times daily to remove allergens from the nasal cavities as well as help with mucous clearance (this is especially helpful to do before the nasal sprays are given) - Consider allergy shots as a  means of long-term control. - Allergy shots "re-train" and "reset" the immune system to ignore environmental allergens and decrease the resulting immune response to those allergens (sneezing, itchy watery eyes, runny nose, nasal congestion, etc).    - Allergy shots improve symptoms in 75-85% of patients.  - We can discuss more at the next appointment if the medications are not working for you.  5. Return in about 3 months (around 11/05/2022). You can have the follow up appointment with Dr. Dellis Anes or a Nurse Practicioner (our Nurse Practitioners are excellent and always have Physician oversight!).    Please inform us of any Emergency Department visits, hospitalizations, or changes in symptoms. Call us before going to the ED for breathing or allergy symptoms since we might be able to fit you in for a sick visit. Feel free to contact us anytime with any questions, problems, or concerns.  It was a pleasure to meet you today!  Websites that have reliable patient information: 1. American Academy of Asthma, Allergy, and Immunology: www.aaaai.org 2. Food Allergy Research and Education (FARE): foodallergy.org 3. Mothers of Asthmatics: http://www.asthmacommunitynetwork.org 4. American College of Allergy, Asthma, and Immunology: www.acaai.org   COVID-19 Vaccine Information can be found at: [email or web address] For questions related to vaccine distribution or appointments, please email [email or web address] or call [phone].   We realize that you might be concerned about having an allergic reaction to the COVID19 vaccines. To help with that concern, WE ARE OFFERING THE COVID19 VACCINES IN OUR OFFICE! Ask the front desk for dates!     "Like" [place] on [employer]  and Instagram for our latest updates!      A healthy democracy works best when [employer] participate! Make sure you are registered to vote! If you have moved or changed any of your  contact information, you will need to get this updated before voting!  In some cases, you MAY be able to register to vote online: [email or web address]      Airborne Adult Perc - 08/05/22 1400     Time Antigen Placed 1432    Allergen Manufacturer Waynette Buttery    Location Back    Number of Test 55    1. Control-Buffer 50% Glycerol Negative    2. Control-Histamine 2+    3. Bahia Negative    4. [place] Negative    5. Johnson Negative    6. [place] Blue Negative    7. Meadow Fescue 2+    8. Perennial Rye 2+    9. Timothy 2+    10. Ragweed Mix Negative    11. Cocklebur Negative    12. Plantain,  English Negative    13. Baccharis Negative    14. Dog Fennel Negative    15. Russian Thistle Negative    16. Lamb's Quarters Negative    17. Sheep Sorrell Negative    18. Rough Pigweed Negative    19. Marsh Elder, Rough Negative    20. Mugwort, Common Negative    21. Box, Elder Negative    22. Cedar, red Negative    23. Sweet Gum Negative    24. Pecan Pollen Negative    25. Pine Mix Negative    26. Walnut, Black Pollen Negative    27. Red Mulberry 2+    28. Ash Mix Negative    29. Birch Mix Negative    30. Beech American Negative    31. Cottonwood, [place] 2+    32. Hickory, White 2+    33. Maple Mix 2+    34. Oak, [place] Mix Negative    35. Sycamore Eastern Negative    36. Alternaria Alternata Negative    37. Cladosporium Herbarum Negative    38. Aspergillus Mix Negative    39. Penicillium Mix Negative    40. Bipolaris Sorokiniana (Helminthosporium) Negative    41. Drechslera Spicifera (Curvularia) Negative    42. Mucor Plumbeus Negative    43. Fusarium Moniliforme 2+    44. Aureobasidium Pullulans (pullulara) Negative    45. Rhizopus Oryzae Negative    46. Botrytis Cinera Negative    47. Epicoccum Nigrum Negative    48. Phoma Betae Negative    49. Dust Mite Mix Negative    50. Cat Hair 10,000 BAU/ml 2+    51.  Dog Epithelia 2+    52. Mixed  Feathers Negative    53. Horse Epithelia 2+    54. Cockroach, German 2+    55. Tobacco Leaf Negative             Food Adult Perc - 08/05/22 1400     Time Antigen Placed 1432    Allergen Manufacturer Waynette Buttery    Location Back    1. Peanut Negative    2. Soybean Negative    3. Wheat Negative    4. Sesame --   3 x 5   5. Milk, Cow Negative    6. Casein --   3 x 5   7. Egg White, Chicken Negative    8. Shellfish Mix Negative    9. Fish Mix Negative    10. Cashew Negative  11. Walnut Food Negative    12. Almond Negative    13. Hazelnut Negative    14. Pecan Food Negative    15. Pistachio Negative    16. [place] Nut Negative    17. Coconut Negative    38. Tomato Negative    60. Strawberry Negative    65. Pineapple --   3 x 5            Reducing Pollen Exposure  The American Academy of Allergy, Asthma and Immunology suggests the following steps to reduce your exposure to pollen during allergy seasons.    Do not hang sheets or clothing out to dry; pollen may collect on these items. Do not mow lawns or spend time around freshly cut grass; mowing stirs up pollen. Keep windows closed at night.  Keep car windows closed while driving. Minimize morning activities outdoors, a time when pollen counts are usually at their highest. Stay indoors as much as possible when pollen counts or humidity is high and on windy days when pollen tends to remain in the air longer. Use air conditioning when possible.  Many air conditioners have filters that trap the pollen spores. Use a HEPA room air filter to remove pollen form the indoor air you breathe.  Control of Mold Allergen   Mold and fungi can grow on a variety of surfaces provided certain temperature and moisture conditions exist.  Outdoor molds grow on plants, decaying vegetation and soil.  The major outdoor mold, Alternaria and Cladosporium, are found in very high numbers during hot and dry conditions.  Generally, a late Summer - Fall  peak is seen for common outdoor fungal spores.  Rain will temporarily lower outdoor mold spore count, but counts rise rapidly when the rainy period ends.  The most important indoor molds are Aspergillus and Penicillium.  Dark, humid and poorly ventilated basements are ideal sites for mold growth.  The next most common sites of mold growth are the bathroom and the kitchen.  Indoor (Perennial) Mold Control   Positive indoor molds via skin testing: Fusarium  Maintain humidity below 50%. Clean washable surfaces with 5% bleach solution. Remove sources e.g. contaminated carpets.   Control of Dog or Cat Allergen  Avoidance is the best way to manage a dog or cat allergy. If you have a dog or cat and are allergic to dog or cats, consider removing the dog or cat from the home. If you have a dog or cat but don't want to find it a new home, or if your family wants a pet even though someone in the household is allergic, here are some strategies that may help keep symptoms at bay:  Keep the pet out of your bedroom and restrict it to only a few rooms. Be advised that keeping the dog or cat in only one room will not limit the allergens to that room. Don't pet, hug or kiss the dog or cat; if you do, wash your hands with soap and water. High-efficiency particulate air (HEPA) cleaners run continuously in a bedroom or living room can reduce allergen levels over time. Regular use of a high-efficiency vacuum cleaner or a central vacuum can reduce allergen levels. Giving your dog or cat a bath at least once a week can reduce airborne allergen.  Control of Cockroach Allergen  Cockroach allergen has been identified as an important cause of acute attacks of asthma, especially in urban settings.  There are fifty-five species of cockroach that exist in the [place], however only three, the [place], [place] and [place] species produce allergen that can affect patients with Asthma.  Allergens can be obtained from  fecal particles, egg casings and secretions from cockroaches.    Remove food sources. Reduce access to water. Seal access and entry points. Spray runways with 0.5-1% Diazinon or Chlorpyrifos Blow boric acid power under stoves and refrigerator. Place bait stations (hydramethylnon) at feeding sites.  Allergy Shots  Allergies are the result of a chain reaction that starts in the immune system. Your immune system controls how your body defends itself. For instance, if you have an allergy to pollen, your immune system identifies pollen as an invader or allergen. Your immune system overreacts by producing antibodies called Immunoglobulin E (IgE). These antibodies travel to cells that release chemicals, causing an allergic reaction.  The concept behind allergy immunotherapy, whether it is received in the form of shots or tablets, is that the immune system can be desensitized to specific allergens that trigger allergy symptoms. Although it requires time and patience, the payback can be long-term relief. Allergy injections contain a dilute solution of those substances that you are allergic to based upon your skin testing and allergy history.   How Do Allergy Shots Work?  Allergy shots work much like a vaccine. Your body responds to injected amounts of a particular allergen given in increasing doses, eventually developing a resistance and tolerance to it. Allergy shots can lead to decreased, minimal or no allergy symptoms.  There generally are two phases: build-up and maintenance. Build-up often ranges from three to six months and involves receiving injections with increasing amounts of the allergens. The shots are typically given once or twice a week, though more rapid build-up schedules are sometimes used.  The maintenance phase begins when the most effective dose is reached. This dose is different for each person, depending on how allergic you are and your response to the build-up injections. Once the  maintenance dose is reached, there are longer periods between injections, typically two to four weeks.  Occasionally doctors give cortisone-type shots that can temporarily reduce allergy symptoms. These types of shots are different and should not be confused with allergy immunotherapy shots.  Who Can Be Treated with Allergy Shots?  Allergy shots may be a good treatment approach for people with allergic rhinitis (hay fever), allergic asthma, conjunctivitis (eye allergy) or stinging insect allergy.   Before deciding to begin allergy shots, you should consider:   The length of allergy season and the severity of your symptoms  Whether medications and/or changes to your environment can control your symptoms  Your desire to avoid long-term medication use  Time: allergy immunotherapy requires a major time commitment  Cost: may vary depending on your insurance coverage  Allergy shots for children age 45 and older are effective and often well tolerated. They might prevent the onset of new allergen sensitivities or the progression to asthma.  Allergy shots are not started on patients who are pregnant but can be continued on patients who become pregnant while receiving them. In some patients with other medical conditions or who take certain common medications, allergy shots may be of risk. It is important to mention other medications you talk to your allergist.   What are the two types of build-ups offered:   RUSH or Rapid Desensitization -- one day of injections lasting from 8:30-4:30pm, injections every 1 hour.  Approximately half of the build-up process is completed in that one day.  The following week, normal build-up  is resumed, and this entails ~16 visits either weekly or twice weekly, until reaching your "maintenance dose" which is continued weekly until eventually getting spaced out to every month for a duration of 3 to 5 years. The regular build-up appointments are nurse visits where the  injections are administered, followed by required monitoring for 30 minutes.    Traditional build-up -- weekly visits for 6 -12 months until reaching "maintenance dose", then continue weekly until eventually spacing out to every 4 weeks as above. At these appointments, the injections are administered, followed by required monitoring for 30 minutes.     Either way is acceptable, and both are equally effective. With the rush protocol, the advantage is that less time is spent here for injections overall AND you would also reach maintenance dosing faster (which is when the clinical benefit starts to become more apparent). Not everyone is a candidate for rapid desensitization.   IF we proceed with the RUSH protocol, there are premedications which must be taken the day before and the day after the rush only (this includes antihistamines, steroids, and Singulair).  After the rush day, no prednisone or Singulair is required, and we just recommend antihistamines taken on your injection day.  What Is An Estimate of the Costs?  If you are interested in starting allergy injections, please check with your insurance company about your coverage for both allergy vial sets and allergy injections.  Please do so prior to making the appointment to start injections.  The following are CPT codes to give to your insurance company. These are the amounts we BILL to the insurance company, but the amount YOU WILL PAY and WE RECEIVE IS SUBSTANTIALLY LESS and depends on the contracts we have with different insurance companies.   Amount Billed to Insurance One allergy vial set  CPT 95165   $ 1200     Two allergy vial set  CPT 95165   $ 2400     Three allergy vial set  CPT 95165   $ 3600     One injection   CPT 95115   $ 35  Two injections   CPT 95117   $ 40 RUSH (Rapid Desensitization) CPT 95180 x 8 hours $500/hour  Regarding the allergy injections, your co-pay may or may not apply with each injection, so please confirm this  with your insurance company. When you start allergy injections, 1 or 2 sets of vials are made based on your allergies.  Not all patients can be on one set of vials. A set of vials lasts 6 months to a year depending on how quickly you can proceed with your build-up of your allergy injections. Vials are personalized for each patient depending on their specific allergens.  How often are allergy injection given during the build-up period?   Injections are given at least weekly during the build-up period until your maintenance dose is achieved. Per the doctor's discretion, you may have the option of getting allergy injections two times per week during the build-up period. However, there must be at least 48 hours between injections. The build-up period is usually completed within 6-12 months depending on your ability to schedule injections and for adjustments for reactions. When maintenance dose is reached, your injection schedule is gradually changed to every two weeks and later to every three weeks. Injections will then continue every 4 weeks. Usually, injections are continued for a total of 3-5 years.   When Will I Feel Better?  Some may experience decreased allergy  symptoms during the build-up phase. For others, it may take as long as 12 months on the maintenance dose. If there is no improvement after a year of maintenance, your allergist will discuss other treatment options with you.  If you aren't responding to allergy shots, it may be because there is not enough dose of the allergen in your vaccine or there are missing allergens that were not identified during your allergy testing. Other reasons could be that there are high levels of the allergen in your environment or major exposure to non-allergic triggers like tobacco smoke.  What Is the Length of Treatment?  Once the maintenance dose is reached, allergy shots are generally continued for three to five years. The decision to stop should be discussed  with your allergist at that time. Some people may experience a permanent reduction of allergy symptoms. Others may relapse and a longer course of allergy shots can be considered.  What Are the Possible Reactions?  The two types of adverse reactions that can occur with allergy shots are local and systemic. Common local reactions include very mild redness and swelling at the injection site, which can happen immediately or several hours after. Report a delayed reaction from your last injection. These include arm swelling or runny nose, watery eyes or cough that occurs within 12-24 hours after injection. A systemic reaction, which is less common, affects the entire body or a particular body system. They are usually mild and typically respond quickly to medications. Signs include increased allergy symptoms such as sneezing, a stuffy nose or hives.   Rarely, a serious systemic reaction called anaphylaxis can develop. Symptoms include swelling in the throat, wheezing, a feeling of tightness in the chest, nausea or dizziness. Most serious systemic reactions develop within 30 minutes of allergy shots. This is why it is strongly recommended you wait in your doctor's office for 30 minutes after your injections. Your allergist is trained to watch for reactions, and his or her staff is trained and equipped with the proper medications to identify and treat them.   Report to the nurse immediately if you experience any of the following symptoms: swelling, itching or redness of the skin, hives, watery eyes/nose, breathing difficulty, excessive sneezing, coughing, stomach pain, diarrhea, or light headedness. These symptoms may occur within 15-20 minutes after injection and may require medication.   Who Should Administer Allergy Shots?  The preferred location for receiving shots is your prescribing allergist's office. Injections can sometimes be given at another facility where the physician and staff are trained to  recognize and treat reactions, and have received instructions by your prescribing allergist.  What if I am late for an injection?   Injection dose will be adjusted depending upon how many days or weeks you are late for your injection.   What if I am sick?   Please report any illness to the nurse before receiving injections. She may adjust your dose or postpone injections depending on your symptoms. If you have fever, flu, sinus infection or chest congestion it is best to postpone allergy injections until you are better. Never get an allergy injection if your asthma is causing you problems. If your symptoms persist, seek out medical care to get your health problem under control.  What If I am or Become Pregnant:  Women that become pregnant should schedule an appointment with [facility] before receiving any further allergy injections.

## 2022-08-05 NOTE — Progress Notes (Unsigned)
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 Allergic reaction - unknown trigger - Testing was slightly reactive to sesame, casein (component of cows milk), and pineapple. - However, none of these was particularly large. - I would avoid sesame, cows milk, and pineapple for now. - We are going to get an alpha gal panel to see if this might be the cause of your reactions. - We are going to get a tryptase as well to rule out mast cell disease. - We are getting a stinging insect panel as well given your history of the large local reaction from the sting on your foot.  - EpiPen training reviewed. - Anaphylaxis management plan provided.  3. Moderate persistent asthma, uncomplicated - Lung testing not done today. - You seem to have everything under good control. - Daily controller medication(s): Advair 100/12mcg one puff twice daily - Prior to physical activity: albuterol 2 puffs 10-15 minutes before physical activity. - Rescue medications: albuterol 4 puffs every 4-6 hours as needed - Asthma control goals:  * Full participation in all desired activities (may need albuterol before activity) * Albuterol use two time or less a week on average (not counting use with activity) * Cough interfering with sleep two time or less a month * Oral steroids no more than once a year * No hospitalizations  4. Seasonal and perennial allergic rhinitis - Testing today showed: grasses, trees, indoor molds, cat, dog, cockroach, and horse - Copy of test results provided.  - Avoidance measures provided. - Start taking: Xyzal (levocetirizine) 5mg  tablet once daily and Flonase (fluticasone) one spray per nostril daily (AIM FOR EAR ON EACH SIDE) -  You can use an extra dose of the antihistamine, if needed, for breakthrough symptoms.  - Consider nasal saline rinses 1-2 times daily to remove allergens from the nasal cavities as well as help with mucous clearance (this is especially helpful to do before the nasal sprays are given) - Consider allergy shots as a means of long-term control. - Allergy shots "re-train" and "reset" the immune system to ignore environmental allergens and decrease the resulting immune response to those allergens (sneezing, itchy watery eyes, runny nose, nasal congestion, etc).    - Allergy shots improve symptoms in 75-85% of patients.  - We can discuss more at the next appointment if the medications are not working for you.  5. Return in about 3 months (around 11/05/2022). You can have the follow up appointment with Dr. Dellis Anes or a Nurse Practicioner (our Nurse Practitioners are excellent and always have Physician oversight!).     This note in its entirety was forwarded to the Provider who requested this consultation.  Subjective:   Amanda Spence is a 52 y.o. female presenting today for evaluation of  Chief Complaint  Patient presents with   Allergic Rhinitis    Allergic Reaction   Wheezing   Pruritus    Amanda Spence has a history of the following: Patient Active Problem List   Diagnosis Date Noted   Moderate persistent asthma, uncomplicated 08/06/2022   Seasonal and perennial allergic rhinitis 08/06/2022   Allergic reaction 08/06/2022    History obtained from: chart review and patient.  Trisha Mangle was referred by Carilyn Goodpasture, NP.  Amanda Spence is a 52 y.o. female presenting for an evaluation of allergic reactions .   Asthma/Respiratory Symptom History: She does have asthma and uses Wixela and ProAir. This is managed by her PCP. She has been on Wixela for the last year or two. She has been on Advair and Breo in the past. First asthma was exercise induced as an adult.   Allergic  Rhinitis Symptom History: She has sneezing and itchy watery eyes. She has this all of the time. She knows that she is allergic to certain cats. She is fine with other cats.   Food Allergy Symptom History: She had some peanut butter in 2015. Then between April and June, she will have itching from head to toe and nausea. She has had a lot of swelling on her face and lips and inside of her mouth. She had some nausea and vomiting as well. Eventually, within ten minutes, the reactions disappears. She has pinpointed pineapple, strawberry and tomato.  She had some reactions with peanuts once (ate a chocolate peanut butter lager). The other time, she is unsure of the triggers. This occurred at night one time, but mostly this happens in the early evening or so.   She eats red meat 2-3 times per week. She does not frequently if ever get tick bites.   She has not taken ibuprofen since everything started. She realized that this might be a possible problem. She was taking generic. She took some name brand and was terrified to try.    She is getting her Masters in Counseling. She has worked in Teacher, music as a Engineer, manufacturing.   Otherwise, there is no history of other atopic diseases, including drug allergies, stinging insect allergies, or contact dermatitis. There is no significant infectious history. Vaccinations are up to date.    Past Medical History: Patient Active Problem List   Diagnosis Date Noted   Moderate persistent asthma, uncomplicated 08/06/2022   Seasonal and perennial allergic rhinitis 08/06/2022   Allergic reaction 08/06/2022    Medication List:  Allergies as of 08/05/2022       Reactions   Biaxin [clarithromycin] Nausea And Vomiting   Metal taste in mouth        Medication List        Accurate as of August 05, 2022 11:59 PM. If you have any questions, ask your nurse or doctor.          STOP taking these medications    Breo Ellipta 100-25 MCG/INH Aepb Generic drug:  fluticasone furoate-vilanterol Stopped by: Alfonse Spruce   cephALEXin 500 MG capsule Commonly known as: KEFLEX Stopped by: Alfonse Spruce   HYDROcodone-acetaminophen 5-325 MG tablet Commonly known as: NORCO/VICODIN Stopped by: Alfonse Spruce   promethazine 25 MG tablet Commonly known as: PHENERGAN Stopped by: Alfonse Spruce   sertraline 100 MG tablet Commonly known as: ZOLOFT Stopped by: Alfonse Spruce   traMADol 50 MG tablet Commonly known as: ULTRAM Stopped by: Alfonse Spruce       TAKE these medications    albuterol 108 (90 Base) MCG/ACT inhaler Commonly known as: VENTOLIN HFA Inhale 2 puffs into the lungs every 4 (four) hours as needed for wheezing or shortness of breath.   escitalopram 20 MG tablet Commonly known as: LEXAPRO Take 20 mg by mouth daily.   levothyroxine 150 MCG tablet Commonly known as: SYNTHROID Take 175 mcg by mouth daily before breakfast.   ondansetron 8 MG tablet Commonly known as: ZOFRAN Take 10 mg  by mouth every 8 (eight) hours as needed for nausea or vomiting.   Wixela Inhub 100-50 MCG/ACT Aepb Generic drug: fluticasone-salmeterol Inhale 1 puff into the lungs 2 (two) times daily.        Birth History: non-contributory  Developmental History: non-contributory  Past Surgical History: Past Surgical History:  Procedure Laterality Date   EXTRACORPOREAL SHOCK WAVE LITHOTRIPSY Left 01/17/2016   Procedure: EXTRACORPOREAL SHOCK WAVE LITHOTRIPSY (ESWL);  Surgeon: Alfredo Martinez, MD;  Location: [facility];  Service: Urology;  Laterality: Left;   LASIK Bilateral 2002   LITHOTRIPSY  2001   URETEROSCOPY WITH HOLMIUM LASER LITHOTRIPSY Right 2008 x2   WISDOM TOOTH EXTRACTION Bilateral 2002     Family History: Family History  Problem Relation Age of Onset   COPD Sister      Social History: Kanae lives at home with her family.  There is no tobacco exposure.  She does not have any pets.  There are no  fume, chemical, or dust exposures.   Review of systems otherwise negative other than that mentioned in the HPI.    Objective:   Blood pressure 122/78, pulse 94, temperature 98.1 F (36.7 C), temperature source Temporal, resp. rate 17, height 5' 2.5" (1.588 m), weight 273 lb 1.6 oz (123.9 kg), SpO2 96%. Body mass index is 49.15 kg/m.     Physical Exam Vitals reviewed.  Constitutional:      Appearance: She is well-developed.  HENT:     Head: Normocephalic and atraumatic.     Right Ear: Tympanic membrane, ear canal and external ear normal. No drainage, swelling or tenderness. Tympanic membrane is not injected, scarred, erythematous, retracted or bulging.     Left Ear: Tympanic membrane, ear canal and external ear normal. No drainage, swelling or tenderness. Tympanic membrane is not injected, scarred, erythematous, retracted or bulging.     Nose: No nasal deformity, septal deviation, mucosal edema or rhinorrhea.     Right Turbinates: Enlarged, swollen and pale.     Left Turbinates: Enlarged, swollen and pale.     Right Sinus: No maxillary sinus tenderness or frontal sinus tenderness.     Left Sinus: No maxillary sinus tenderness or frontal sinus tenderness.     Mouth/Throat:     Mouth: Mucous membranes are not pale and not dry.     Pharynx: Uvula midline.  Eyes:     General:        Right eye: No discharge.        Left eye: No discharge.     Conjunctiva/sclera: Conjunctivae normal.     Right eye: Right conjunctiva is not injected. No chemosis.    Left eye: Left conjunctiva is not injected. No chemosis.    Pupils: Pupils are equal, round, and reactive to light.  Cardiovascular:     Rate and Rhythm: Normal rate and regular rhythm.     Heart sounds: Normal heart sounds.  Pulmonary:     Effort: Pulmonary effort is normal. No tachypnea, accessory muscle usage or respiratory distress.     Breath sounds: Normal breath sounds. No wheezing, rhonchi or rales.  Chest:     Chest wall:  No tenderness.  Abdominal:     Tenderness: There is no abdominal tenderness. There is no guarding or rebound.  Lymphadenopathy:     Head:     Right side of head: No submandibular, tonsillar or occipital adenopathy.     Left side of head: No submandibular, tonsillar or occipital adenopathy.     Cervical: No cervical  adenopathy.  Skin:    Coloration: Skin is not pale.     Findings: No abrasion, erythema, petechiae or rash. Rash is not papular, urticarial or vesicular.  Neurological:     Mental Status: She is alert.  Psychiatric:        Behavior: Behavior is cooperative.      Diagnostic studies:   Allergy Studies:     Airborne Adult Perc - 08/05/22 1400     Time Antigen Placed 1432    Allergen Manufacturer Waynette Buttery    Location Back    Number of Test 55    1. Control-Buffer 50% Glycerol Negative    2. Control-Histamine 2+    3. Bahia Negative    4. French Southern Territ[place] Negative    5. Johnson Negative    6. Kentucky Blue Negative    7. Meadow Fescue 2+    8. Perennial Rye 2+    9. Timothy 2+    10. Ragweed Mix Negative    11. Cocklebur Negative    12. Plantain,  English Negative    13. Baccharis Negative    14. Dog Fennel Negative    15. Russian Thistle Negative    16. Lamb's Quarters Negative    17. Sheep Sorrell Negative    18. Rough Pigweed Negative    19. Marsh Elder, Rough Negative    20. Mugwort, Common Negative    21. Box, Elder Negative    22. Cedar, red Negative    23. Sweet Gum Negative    24. Pecan Pollen Negative    25. Pine Mix Negative    26. Walnut, Black Pollen Negative    27. Red Mulberry 2+    28. Ash Mix Negative    29. Birch Mix Negative    30. Beech American Negative    31. Cottonwood, [place] 2+    32. Hickory, White 2+    33. Maple Mix 2+    34. Oak, [place] Mix Negative    35. Sycamore Eastern Negative    36. Alternaria Alternata Negative    37. Cladosporium Herbarum Negative    38. Aspergillus Mix Negative    39. Penicillium Mix Negative    40.  Bipolaris Sorokiniana (Helminthosporium) Negative    41. Drechslera Spicifera (Curvularia) Negative    42. Mucor Plumbeus Negative    43. Fusarium Moniliforme 2+    44. Aureobasidium Pullulans (pullulara) Negative    45. Rhizopus Oryzae Negative    46. Botrytis Cinera Negative    47. Epicoccum Nigrum Negative    48. Phoma Betae Negative    49. Dust Mite Mix Negative    50. Cat Hair 10,000 BAU/ml 2+    51.  Dog Epithelia 2+    52. Mixed Feathers Negative    53. Horse Epithelia 2+    54. Cockroach, German 2+    55. Tobacco Leaf Negative             Food Adult Perc - 08/05/22 1400     Time Antigen Placed 1432    Allergen Manufacturer Waynette Buttery    Location Back    1. Peanut Negative    2. Soybean Negative    3. Wheat Negative    4. Sesame --   3 x 5   5. Milk, Cow Negative    6. Casein --   3 x 5   7. Egg White, Chicken Negative    8. Shellfish Mix Negative    9. Fish Mix Negative    10. Cashew  Negative    11. Walnut Food Negative    12. Almond Negative    13. Hazelnut Negative    14. Pecan Food Negative    15. Pistachio Negative    16. [place] Nut Negative    17. Coconut Negative    38. Tomato Negative    60. Strawberry Negative    65. Pineapple --   3 x 5            Allergy testing results were read and interpreted by myself, documented by clinical staff.         Malachi Bonds, MD [facility]

## 2022-08-06 DIAGNOSIS — T7840XA Allergy, unspecified, initial encounter: Secondary | ICD-10-CM | POA: Insufficient documentation

## 2022-08-06 DIAGNOSIS — J4540 Moderate persistent asthma, uncomplicated: Secondary | ICD-10-CM | POA: Insufficient documentation

## 2022-08-06 DIAGNOSIS — J302 Other seasonal allergic rhinitis: Secondary | ICD-10-CM | POA: Insufficient documentation

## 2022-08-07 LAB — ALPHA-GAL PANEL

## 2022-08-07 LAB — ALLERGEN FIRE ANT

## 2022-08-09 LAB — TRYPTASE: Tryptase: 3.2 ug/L (ref 2.2–13.2)

## 2022-08-09 LAB — ALLERGEN HYMENOPTERA PANEL
Bumblebee: <0.1 kU/L
Honeybee IgE: 0.39 kU/L — AB
Hornet, White Face, IgE: <0.1 kU/L
Hornet, Yellow, IgE: <0.1 kU/L
Paper Wasp IgE: <0.1 kU/L
Yellow Jacket, IgE: <0.1 kU/L

## 2022-11-06 ENCOUNTER — Ambulatory Visit: Payer: Commercial Managed Care - PPO | Admitting: Allergy & Immunology

## 2022-11-20 ENCOUNTER — Telehealth: Payer: Self-pay | Admitting: *Deleted

## 2022-11-20 NOTE — Telephone Encounter (Signed)
Spoke with the patient regarding the referral to GYN oncology. Patient scheduled as new patient with Dr Pricilla Holm on 11/22 at 9:45 am. Patient given an arrival time of 9:15 am.  Explained to the patient the the doctor will perform a pelvic exam at this visit. Patient given the policy that only one visitor allowed and that visitor must be over 16 yrs are allowed in the Cancer Center. Patient given the address/phone number for the clinic and that the center offers free valet service. Patient aware that masks are option.

## 2022-11-26 ENCOUNTER — Telehealth: Payer: Self-pay | Admitting: *Deleted

## 2022-11-26 ENCOUNTER — Encounter: Payer: Self-pay | Admitting: Gynecologic Oncology

## 2022-11-26 NOTE — Telephone Encounter (Signed)
Called and moved the patient from 11/22 to 11/14

## 2022-11-27 ENCOUNTER — Inpatient Hospital Stay (HOSPITAL_BASED_OUTPATIENT_CLINIC_OR_DEPARTMENT_OTHER): Payer: Commercial Managed Care - PPO | Admitting: Gynecologic Oncology

## 2022-11-27 ENCOUNTER — Encounter: Payer: Self-pay | Admitting: Gynecologic Oncology

## 2022-11-27 ENCOUNTER — Inpatient Hospital Stay: Payer: Commercial Managed Care - PPO | Attending: Gynecologic Oncology | Admitting: Gynecologic Oncology

## 2022-11-27 VITALS — BP 133/76 | HR 87 | Temp 98.2°F | Resp 20 | Ht 62.0 in | Wt 265.6 lb

## 2022-11-27 DIAGNOSIS — L299 Pruritus, unspecified: Secondary | ICD-10-CM

## 2022-11-27 DIAGNOSIS — F419 Anxiety disorder, unspecified: Secondary | ICD-10-CM | POA: Diagnosis not present

## 2022-11-27 DIAGNOSIS — M1990 Unspecified osteoarthritis, unspecified site: Secondary | ICD-10-CM | POA: Diagnosis not present

## 2022-11-27 DIAGNOSIS — Z801 Family history of malignant neoplasm of trachea, bronchus and lung: Secondary | ICD-10-CM | POA: Insufficient documentation

## 2022-11-27 DIAGNOSIS — Z794 Long term (current) use of insulin: Secondary | ICD-10-CM | POA: Diagnosis not present

## 2022-11-27 DIAGNOSIS — Z7985 Long-term (current) use of injectable non-insulin antidiabetic drugs: Secondary | ICD-10-CM | POA: Insufficient documentation

## 2022-11-27 DIAGNOSIS — Z6841 Body Mass Index (BMI) 40.0 and over, adult: Secondary | ICD-10-CM | POA: Diagnosis not present

## 2022-11-27 DIAGNOSIS — N939 Abnormal uterine and vaginal bleeding, unspecified: Secondary | ICD-10-CM

## 2022-11-27 DIAGNOSIS — Z79899 Other long term (current) drug therapy: Secondary | ICD-10-CM | POA: Insufficient documentation

## 2022-11-27 DIAGNOSIS — G4733 Obstructive sleep apnea (adult) (pediatric): Secondary | ICD-10-CM | POA: Insufficient documentation

## 2022-11-27 DIAGNOSIS — E1165 Type 2 diabetes mellitus with hyperglycemia: Secondary | ICD-10-CM | POA: Diagnosis not present

## 2022-11-27 DIAGNOSIS — E039 Hypothyroidism, unspecified: Secondary | ICD-10-CM | POA: Diagnosis not present

## 2022-11-27 DIAGNOSIS — C541 Malignant neoplasm of endometrium: Secondary | ICD-10-CM | POA: Diagnosis present

## 2022-11-27 DIAGNOSIS — R112 Nausea with vomiting, unspecified: Secondary | ICD-10-CM | POA: Diagnosis not present

## 2022-11-27 NOTE — H&P (View-Only) (Signed)
 GYNECOLOGIC ONCOLOGY NEW PATIENT CONSULTATION   Patient Name: Amanda Spence  Patient Age: 52 y.o. Date of Service: 11/27/22 Referring Provider: Steva Ready, DO  Primary Care Provider: Carilyn Goodpasture, NP Consulting Provider: Eugene Garnet, MD   Assessment/Plan:  Peri versus postmenopausal patient with low-grade endometrioid endometrial adenocarcinoma.  We reviewed the nature of endometrial cancer and its recommended surgical staging, including total hysterectomy, bilateral salpingo-oophorectomy, and lymph node assessment.  Unfortunately, in the setting of her poorly controlled diabetes, the patient is not currently a suitable candidate for staging surgery.  We discussed her poorly controlled type 2 diabetes and that this increases her risk of perioperative cardiac risk as well as postoperative morbidity.  We discussed that most endometrial cancer is detected early.  We reviewed the role of progesterone therapy and the effect on preneoplastic and neoplastic lesions, believed to include induction of apoptosis in addition to tissue sloughing during withdrawal bleeding.  Activation of the progesterone receptors is believed to lead stromal decidualization and thinning of the lining.  We reviewed the 3 most studied options, to include levonorgesterol IUD, oral medorxyprogesterone acetate, or oral megesterol acetate.    She understands that all options have few side effects, most common being infrequent edema, GI disturbances, and thromboembolic events), but that local progesterone through IUD may have a stronger effect on the endometrium with less systemic side effects.  Furthermore, studies demonstrate that 50-90% of women will have regression of their low grade endometrial cancer with progesterone use.    For monitoring, there is no recommend standard of care.  Depending on amount of delay until we are able to proceed with definitive surgery, we will base her surveillance on GOG 224  protocol.  This will include EMB at 3 months following initiation of treatment.  EMBs will be performed at 3 to 6 month intervals, until a minimum of 3 negative biopsy results are obtained, after which sampling frequeny may then be yearly or until new abnormal uterine bleeding develops.  If persistence of cancer is noted by 9 months of treatment, then we will discuss additional agents or fitness/readiness for surgery.    We also reviewed the importance of weight loss in overall health as well as reduction in cancer risk.  She was given information about weight loss programs in town.  I performed weight loss counseling today.  We discussed starting a food diary.  The patient's type 2 diabetes is treated by her primary care provider.  I suggested we place a referral for endocrinology in the event that we do not see significant decrease in her hemoglobin A1c at her next check in December.  Referral placed today.  Patient was given a copy of our fax number here at the office.  She was asked to either have her PCP fax her next hemoglobin A1c to [place] or to call herself with these results.  Given plan to defer definitive surgery at this time, discussed MRI to look for metastatic disease as well as to assess myometrial invasion.  Given her collection of symptoms that are somewhat suggestive of catecholamine release, I suggested adding a CT of her abdomen as well.  A copy of this note was sent to the patient's referring provider.   60 minutes of total time was spent for this patient encounter, including preparation, face-to-face counseling with the patient and coordination of care, and documentation of the encounter.  Eugene Garnet, MD  Division of Gynecologic Oncology  Department of Obstetrics and Gynecology  [facility]  ___________________________________________  Chief Complaint: Chief Complaint  Patient presents with   endometrial cancer    History of Present Illness:   Amanda Spence is a 52 y.o. y.o. female who is seen in consultation at the request of Steva Ready, DO for an evaluation of endometrial cancer.  The patient reports having normal menses until a year and a half ago.  At that time, she began skipping up to several months between menses.  She denies any intermenstrual bleeding.  More recently, in March of this year, she began having spotting most days.  She describes this as blood when she wipes not requiring the use of a pad.  She continued to have more menstrual-like bleeding every 2-3 months.  She had a menstrual cycle in June, again after her Pap test in September, and again after her endometrial biopsy.  Pap test on 10/10/2022 revealed atypical glandular cells of undetermined significance.  High risk HPV negative.  Endometrial biopsy performed on 10/29 revealed well-differentiated endometrial adenocarcinoma.  Cervical biopsy and endocervical curettings showed benign ecto and endocervical tissue.  Her recent bleeding just stopped a couple of days ago.  She describes her menses as lasting for 7-8 days requiring the use of 2-3 thicker pads.  She will sometimes saturate the pads for 1 or 2 days of bleeding.  She has some minimal associated cramping.  She notes some decreased appetite and increased anxiety since her diagnosis.  She endorses normal bowel and bladder function.  She denies any recent weight changes.  Her medical history is notable for type 2 diabetes.  Her last hemoglobin A1c in September was 12.5%.  Her primary care provider manages her diabetes.  She had been on metformin and Ozempic, was started on Tresiba after her recent A1c.  Her blood glucose normally runs between 150 and 170 although recently she notes that sugars have been closer to 200.  She describes having symptoms since the spring that began with pruritus (of her head and genital area that then becomes full body pruritus) which is followed by diarrhea and nausea.  She  ultimately throws up and then almost immediately has relief.  She sometimes has associated flushing or feels hot.  She has undergone allergy workup which revealed allergies to several different substances.  Even after elimination of the substances, she has continued to have symptoms.  Initially the symptoms were happening once every few weeks, but now they are happening on a weekly basis.  Her symptoms last generally for an hour or hour and a half.  Benadryl and Pepto-Bismol have helped.  She has sleep apnea, uses a CPAP.  PAST MEDICAL HISTORY:  Past Medical History:  Diagnosis Date   Anxiety    Arthritis    osteoarthritis   Asthma    Eczema    Endometrial cancer (HCC)    History of kidney stones    Hypothyroidism    OSA (obstructive sleep apnea)    Type 2 diabetes mellitus (HCC)    ozempic, metformin     PAST SURGICAL HISTORY:  Past Surgical History:  Procedure Laterality Date   EXTRACORPOREAL SHOCK WAVE LITHOTRIPSY Left 01/17/2016   Procedure: EXTRACORPOREAL SHOCK WAVE LITHOTRIPSY (ESWL);  Surgeon: Alfredo Martinez, MD;  Location: [facility];  Service: Urology;  Laterality: Left;   FOOT SURGERY     HAND SURGERY     LASIK Bilateral 2002   LITHOTRIPSY  2001   URETEROSCOPY WITH HOLMIUM LASER LITHOTRIPSY Right 2008 x2   WISDOM TOOTH EXTRACTION Bilateral  2002    OB/GYN HISTORY:  OB History  Gravida Para Term Preterm AB Living  0 0 0 0 0 0  SAB IAB Ectopic Multiple Live Births  0 0 0 0 0    No LMP recorded. Patient is perimenopausal.  Age at menarche: 81 Age at menopause: Unknown Hx of HRT: denies Hx of STDs: denies Last pap: 2024 - see HPI History of abnormal pap smears: see HPI  SCREENING STUDIES:  Last mammogram: 2020  Last colonoscopy: n/a  MEDICATIONS: Outpatient Encounter Medications as of 11/27/2022  Medication Sig   albuterol (PROVENTIL HFA;VENTOLIN HFA) 108 (90 Base) MCG/ACT inhaler Inhale 2 puffs into the lungs every 4 (four) hours as needed for wheezing or  shortness of breath.   Cholecalciferol 50 MCG (2000 UT) CAPS 1 capsule every day by oral route.   Cinnamon 500 MG capsule Take by mouth.   EPINEPHrine 0.3 mg/0.3 mL IJ SOAJ injection AS DIRECTED INJECTION USE IF EXPOSED TO ALLERGEN WITH SWELLING OF THROAT ETC AS NEEDED   escitalopram (LEXAPRO) 20 MG tablet Take 20 mg by mouth daily.   fexofenadine-pseudoephedrine (ALLEGRA-D ALLERGY & CONGESTION) 180-240 MG 24 hr tablet    fluticasone (FLONASE ALLERGY RELIEF) 50 MCG/ACT nasal spray 2 sprays every day by nasal route.   fluticasone-salmeterol (WIXELA INHUB) 100-50 MCG/ACT AEPB Inhale 1 puff into the lungs 2 (two) times daily.   levothyroxine (SYNTHROID, LEVOTHROID) 150 MCG tablet Take 200 mcg by mouth daily before breakfast.   lisinopril (ZESTRIL) 10 MG tablet Take 10 mg by mouth daily.   metFORMIN (GLUCOPHAGE-XR) 500 MG 24 hr tablet Take 1,000 mg by mouth 2 (two) times daily.   ondansetron (ZOFRAN) 8 MG tablet Take 10 mg by mouth every 8 (eight) hours as needed for nausea or vomiting.   Semaglutide,0.25 or 0.5MG /DOS, (OZEMPIC, 0.25 OR 0.5 MG/DOSE,) 2 MG/3ML SOPN INJECT 0.25 MG UNDER THE SKIN ONCE A WEEK 30 DAYS   TRESIBA 100 UNIT/ML SOLN PLEASE SEE ATTACHED FOR DETAILED DIRECTIONS   zolpidem (AMBIEN) 10 MG tablet TAKE 1 TABLET BY MOUTH EVERY DAY AT BEDTIME AS NEEDED FOR 15 DAYS   No facility-administered encounter medications on file as of 11/27/2022.    ALLERGIES:  Allergies  Allergen Reactions   Bee Venom Hives, Itching, Other (See Comments), Rash and Swelling   Biaxin [Clarithromycin] Nausea And Vomiting    Metal taste in mouth   Pineapple Other (See Comments)    Mouth sores   Sesame Seed (Diagnostic) Nausea Only     FAMILY HISTORY:  Family History  Problem Relation Age of Onset   Lung cancer Father    COPD Sister    Endometrial cancer Sister    Lung cancer Paternal Aunt    Lung cancer Paternal Grandmother    Breast cancer Neg Hx    Ovarian cancer Neg Hx    Pancreatic cancer  Neg Hx    Colon cancer Neg Hx    Prostate cancer Neg Hx      SOCIAL HISTORY:  Social Connections: Not on file    REVIEW OF SYSTEMS:  + headache (history of migraines), vaginal bleeding Denies appetite changes, fevers, chills, fatigue, unexplained weight changes. Denies hearing loss, neck lumps or masses, mouth sores, ringing in ears or voice changes. Denies cough or wheezing.  Denies shortness of breath. Denies chest pain or palpitations. Denies leg swelling. Denies abdominal distention, pain, blood in stools, constipation, diarrhea, nausea, vomiting, or early satiety. Denies pain with intercourse, dysuria, frequency, hematuria or incontinence. Denies hot  flashes, pelvic pain, vaginal discharge.   Denies joint pain, back pain or muscle pain/cramps. Denies itching, rash, or wounds. Denies dizziness, numbness or seizures. Denies swollen lymph nodes or glands, denies easy bruising or bleeding. Denies depression, confusion, or decreased concentration.  Physical Exam:  Vital Signs for this encounter:  Blood pressure 133/76, pulse 87, temperature 98.2 F (36.8 C), temperature source Oral, resp. rate 20, height 5\' 2"  (1.575 m), weight 265 lb 9.6 oz (120.5 kg), SpO2 98%. Body mass index is 48.58 kg/m. General: Alert, oriented, no acute distress.  HEENT: Normocephalic, atraumatic. Sclera anicteric.  Chest: Clear to auscultation bilaterally. No wheezes, rhonchi, or rales. Cardiovascular: Regular rate and rhythm, no murmurs, rubs, or gallops.  Abdomen: Obese. Normoactive bowel sounds. Soft, nondistended, nontender to palpation. No masses or hepatosplenomegaly appreciated. No palpable fluid wave.  Extremities: Grossly normal range of motion. Warm, well perfused. No edema bilaterally.  Skin: No rashes or lesions.  GU: Deferred.  LABORATORY AND RADIOLOGIC DATA:  Outside medical records were reviewed to synthesize the above history, along with the history and physical obtained during the  visit.   Lab Results  Component Value Date   WBC 13.3 (H) 11/08/2009   HGB 14.9 11/08/2009   HCT 44.7 11/08/2009   PLT 287 11/08/2009   GLUCOSE 124 (H) 11/08/2009   ALT 41 (H) 11/08/2009   AST 30 11/08/2009   NA 137 11/08/2009   K 3.7 11/08/2009   CL 106 11/08/2009   CREATININE 0.94 11/08/2009   BUN 13 11/08/2009   CO2 26 11/08/2009

## 2022-11-27 NOTE — Progress Notes (Signed)
GYNECOLOGIC ONCOLOGY NEW PATIENT CONSULTATION   Patient Name: Amanda Spence  Patient Age: 52 y.o. Date of Service: 11/27/22 Referring Provider: Steva Ready, DO  Primary Care Provider: Carilyn Goodpasture, NP Consulting Provider: Eugene Garnet, MD   Assessment/Plan:  Peri versus postmenopausal patient with low-grade endometrioid endometrial adenocarcinoma.  We reviewed the nature of endometrial cancer and its recommended surgical staging, including total hysterectomy, bilateral salpingo-oophorectomy, and lymph node assessment.  Unfortunately, in the setting of her poorly controlled diabetes, the patient is not currently a suitable candidate for staging surgery.  We discussed her poorly controlled type 2 diabetes and that this increases her risk of perioperative cardiac risk as well as postoperative morbidity.  We discussed that most endometrial cancer is detected early.  We reviewed the role of progesterone therapy and the effect on preneoplastic and neoplastic lesions, believed to include induction of apoptosis in addition to tissue sloughing during withdrawal bleeding.  Activation of the progesterone receptors is believed to lead stromal decidualization and thinning of the lining.  We reviewed the 3 most studied options, to include levonorgesterol IUD, oral medorxyprogesterone acetate, or oral megesterol acetate.    She understands that all options have few side effects, most common being infrequent edema, GI disturbances, and thromboembolic events), but that local progesterone through IUD may have a stronger effect on the endometrium with less systemic side effects.  Furthermore, studies demonstrate that 50-90% of women will have regression of their low grade endometrial cancer with progesterone use.    For monitoring, there is no recommend standard of care.  Depending on amount of delay until we are able to proceed with definitive surgery, we will base her surveillance on GOG 224  protocol.  This will include EMB at 3 months following initiation of treatment.  EMBs will be performed at 3 to 6 month intervals, until a minimum of 3 negative biopsy results are obtained, after which sampling frequeny may then be yearly or until new abnormal uterine bleeding develops.  If persistence of cancer is noted by 9 months of treatment, then we will discuss additional agents or fitness/readiness for surgery.    We also reviewed the importance of weight loss in overall health as well as reduction in cancer risk.  She was given information about weight loss programs in town.  I performed weight loss counseling today.  We discussed starting a food diary.  The patient's type 2 diabetes is treated by her primary care provider.  I suggested we place a referral for endocrinology in the event that we do not see significant decrease in her hemoglobin A1c at her next check in December.  Referral placed today.  Patient was given a copy of our fax number here at the office.  She was asked to either have her PCP fax her next hemoglobin A1c to [place] or to call herself with these results.  Given plan to defer definitive surgery at this time, discussed MRI to look for metastatic disease as well as to assess myometrial invasion.  Given her collection of symptoms that are somewhat suggestive of catecholamine release, I suggested adding a CT of her abdomen as well.  A copy of this note was sent to the patient's referring provider.   60 minutes of total time was spent for this patient encounter, including preparation, face-to-face counseling with the patient and coordination of care, and documentation of the encounter.  Eugene Garnet, MD  Division of Gynecologic Oncology  Department of Obstetrics and Gynecology  [facility]  ___________________________________________  Chief Complaint: Chief Complaint  Patient presents with   endometrial cancer    History of Present Illness:   Amanda Spence is a 52 y.o. y.o. female who is seen in consultation at the request of Steva Ready, DO for an evaluation of endometrial cancer.  The patient reports having normal menses until a year and a half ago.  At that time, she began skipping up to several months between menses.  She denies any intermenstrual bleeding.  More recently, in March of this year, she began having spotting most days.  She describes this as blood when she wipes not requiring the use of a pad.  She continued to have more menstrual-like bleeding every 2-3 months.  She had a menstrual cycle in June, again after her Pap test in September, and again after her endometrial biopsy.  Pap test on 10/10/2022 revealed atypical glandular cells of undetermined significance.  High risk HPV negative.  Endometrial biopsy performed on 10/29 revealed well-differentiated endometrial adenocarcinoma.  Cervical biopsy and endocervical curettings showed benign ecto and endocervical tissue.  Her recent bleeding just stopped a couple of days ago.  She describes her menses as lasting for 7-8 days requiring the use of 2-3 thicker pads.  She will sometimes saturate the pads for 1 or 2 days of bleeding.  She has some minimal associated cramping.  She notes some decreased appetite and increased anxiety since her diagnosis.  She endorses normal bowel and bladder function.  She denies any recent weight changes.  Her medical history is notable for type 2 diabetes.  Her last hemoglobin A1c in September was 12.5%.  Her primary care provider manages her diabetes.  She had been on metformin and Ozempic, was started on Tresiba after her recent A1c.  Her blood glucose normally runs between 150 and 170 although recently she notes that sugars have been closer to 200.  She describes having symptoms since the spring that began with pruritus (of her head and genital area that then becomes full body pruritus) which is followed by diarrhea and nausea.  She  ultimately throws up and then almost immediately has relief.  She sometimes has associated flushing or feels hot.  She has undergone allergy workup which revealed allergies to several different substances.  Even after elimination of the substances, she has continued to have symptoms.  Initially the symptoms were happening once every few weeks, but now they are happening on a weekly basis.  Her symptoms last generally for an hour or hour and a half.  Benadryl and Pepto-Bismol have helped.  She has sleep apnea, uses a CPAP.  PAST MEDICAL HISTORY:  Past Medical History:  Diagnosis Date   Anxiety    Arthritis    osteoarthritis   Asthma    Eczema    Endometrial cancer (HCC)    History of kidney stones    Hypothyroidism    OSA (obstructive sleep apnea)    Type 2 diabetes mellitus (HCC)    ozempic, metformin     PAST SURGICAL HISTORY:  Past Surgical History:  Procedure Laterality Date   EXTRACORPOREAL SHOCK WAVE LITHOTRIPSY Left 01/17/2016   Procedure: EXTRACORPOREAL SHOCK WAVE LITHOTRIPSY (ESWL);  Surgeon: Alfredo Martinez, MD;  Location: [facility];  Service: Urology;  Laterality: Left;   FOOT SURGERY     HAND SURGERY     LASIK Bilateral 2002   LITHOTRIPSY  2001   URETEROSCOPY WITH HOLMIUM LASER LITHOTRIPSY Right 2008 x2   WISDOM TOOTH EXTRACTION Bilateral  2002    OB/GYN HISTORY:  OB History  Gravida Para Term Preterm AB Living  0 0 0 0 0 0  SAB IAB Ectopic Multiple Live Births  0 0 0 0 0    No LMP recorded. Patient is perimenopausal.  Age at menarche: 81 Age at menopause: Unknown Hx of HRT: denies Hx of STDs: denies Last pap: 2024 - see HPI History of abnormal pap smears: see HPI  SCREENING STUDIES:  Last mammogram: 2020  Last colonoscopy: n/a  MEDICATIONS: Outpatient Encounter Medications as of 11/27/2022  Medication Sig   albuterol (PROVENTIL HFA;VENTOLIN HFA) 108 (90 Base) MCG/ACT inhaler Inhale 2 puffs into the lungs every 4 (four) hours as needed for wheezing or  shortness of breath.   Cholecalciferol 50 MCG (2000 UT) CAPS 1 capsule every day by oral route.   Cinnamon 500 MG capsule Take by mouth.   EPINEPHrine 0.3 mg/0.3 mL IJ SOAJ injection AS DIRECTED INJECTION USE IF EXPOSED TO ALLERGEN WITH SWELLING OF THROAT ETC AS NEEDED   escitalopram (LEXAPRO) 20 MG tablet Take 20 mg by mouth daily.   fexofenadine-pseudoephedrine (ALLEGRA-D ALLERGY & CONGESTION) 180-240 MG 24 hr tablet    fluticasone (FLONASE ALLERGY RELIEF) 50 MCG/ACT nasal spray 2 sprays every day by nasal route.   fluticasone-salmeterol (WIXELA INHUB) 100-50 MCG/ACT AEPB Inhale 1 puff into the lungs 2 (two) times daily.   levothyroxine (SYNTHROID, LEVOTHROID) 150 MCG tablet Take 200 mcg by mouth daily before breakfast.   lisinopril (ZESTRIL) 10 MG tablet Take 10 mg by mouth daily.   metFORMIN (GLUCOPHAGE-XR) 500 MG 24 hr tablet Take 1,000 mg by mouth 2 (two) times daily.   ondansetron (ZOFRAN) 8 MG tablet Take 10 mg by mouth every 8 (eight) hours as needed for nausea or vomiting.   Semaglutide,0.25 or 0.5MG /DOS, (OZEMPIC, 0.25 OR 0.5 MG/DOSE,) 2 MG/3ML SOPN INJECT 0.25 MG UNDER THE SKIN ONCE A WEEK 30 DAYS   TRESIBA 100 UNIT/ML SOLN PLEASE SEE ATTACHED FOR DETAILED DIRECTIONS   zolpidem (AMBIEN) 10 MG tablet TAKE 1 TABLET BY MOUTH EVERY DAY AT BEDTIME AS NEEDED FOR 15 DAYS   No facility-administered encounter medications on file as of 11/27/2022.    ALLERGIES:  Allergies  Allergen Reactions   Bee Venom Hives, Itching, Other (See Comments), Rash and Swelling   Biaxin [Clarithromycin] Nausea And Vomiting    Metal taste in mouth   Pineapple Other (See Comments)    Mouth sores   Sesame Seed (Diagnostic) Nausea Only     FAMILY HISTORY:  Family History  Problem Relation Age of Onset   Lung cancer Father    COPD Sister    Endometrial cancer Sister    Lung cancer Paternal Aunt    Lung cancer Paternal Grandmother    Breast cancer Neg Hx    Ovarian cancer Neg Hx    Pancreatic cancer  Neg Hx    Colon cancer Neg Hx    Prostate cancer Neg Hx      SOCIAL HISTORY:  Social Connections: Not on file    REVIEW OF SYSTEMS:  + headache (history of migraines), vaginal bleeding Denies appetite changes, fevers, chills, fatigue, unexplained weight changes. Denies hearing loss, neck lumps or masses, mouth sores, ringing in ears or voice changes. Denies cough or wheezing.  Denies shortness of breath. Denies chest pain or palpitations. Denies leg swelling. Denies abdominal distention, pain, blood in stools, constipation, diarrhea, nausea, vomiting, or early satiety. Denies pain with intercourse, dysuria, frequency, hematuria or incontinence. Denies hot  flashes, pelvic pain, vaginal discharge.   Denies joint pain, back pain or muscle pain/cramps. Denies itching, rash, or wounds. Denies dizziness, numbness or seizures. Denies swollen lymph nodes or glands, denies easy bruising or bleeding. Denies depression, confusion, or decreased concentration.  Physical Exam:  Vital Signs for this encounter:  Blood pressure 133/76, pulse 87, temperature 98.2 F (36.8 C), temperature source Oral, resp. rate 20, height 5\' 2"  (1.575 m), weight 265 lb 9.6 oz (120.5 kg), SpO2 98%. Body mass index is 48.58 kg/m. General: Alert, oriented, no acute distress.  HEENT: Normocephalic, atraumatic. Sclera anicteric.  Chest: Clear to auscultation bilaterally. No wheezes, rhonchi, or rales. Cardiovascular: Regular rate and rhythm, no murmurs, rubs, or gallops.  Abdomen: Obese. Normoactive bowel sounds. Soft, nondistended, nontender to palpation. No masses or hepatosplenomegaly appreciated. No palpable fluid wave.  Extremities: Grossly normal range of motion. Warm, well perfused. No edema bilaterally.  Skin: No rashes or lesions.  GU: Deferred.  LABORATORY AND RADIOLOGIC DATA:  Outside medical records were reviewed to synthesize the above history, along with the history and physical obtained during the  visit.   Lab Results  Component Value Date   WBC 13.3 (H) 11/08/2009   HGB 14.9 11/08/2009   HCT 44.7 11/08/2009   PLT 287 11/08/2009   GLUCOSE 124 (H) 11/08/2009   ALT 41 (H) 11/08/2009   AST 30 11/08/2009   NA 137 11/08/2009   K 3.7 11/08/2009   CL 106 11/08/2009   CREATININE 0.94 11/08/2009   BUN 13 11/08/2009   CO2 26 11/08/2009

## 2022-11-27 NOTE — Patient Instructions (Addendum)
Plan on having a MRI of the pelvis and a CT scan of the abdomen before surgery. For the CT scan, nothing to eat or drink 4 hours before.   Dr. Pricilla Holm recommends the applications for your phone called My fitness pal and Lose it.   Preparing for your Surgery  You will need to hold your ozempic for 7 days before surgery.  Plan for surgery on December 17, 2022 with Dr. Eugene Garnet at [facility]. You will be scheduled for examination under anesthesia, hysteroscopy with dilation and curettage of the uterus, Mirena IUD placement, tilt test to see if lungs can tolerate positioning.   Pre-operative Testing -You will receive a phone call from presurgical testing at [facility] to discuss surgery instructions and arrange for lab work if needed.  -Bring your insurance card, copy of an advanced directive if applicable, medication list.  -You should not be taking blood thinners or aspirin at least ten days prior to surgery unless instructed by your surgeon.  -Do not take supplements such as fish oil (omega 3), red yeast rice, turmeric before your surgery. You want to avoid medications with aspirin in them including headache powders such as BC or Goody's), Excedrin migraine.  Day Before Surgery at Home -You will be advised you can have clear liquids up until 3 hours before your surgery.    Your role in recovery Your role is to become active as soon as directed by your doctor, while still giving yourself time to heal.  Rest when you feel tired. You will be asked to do the following in order to speed your recovery:  - Cough and breathe deeply. This helps to clear and expand your lungs and can prevent pneumonia after surgery.  - STAY ACTIVE WHEN YOU GET HOME. Do mild physical activity. Walking or moving your legs help your circulation and body functions return to normal. Do not try to get up or walk alone the first time after surgery.   -If you develop swelling on one leg or the  other, pain in the back of your leg, redness/warmth in one of your legs, please call the office or go to the Emergency Room to have a doppler to rule out a blood clot. For shortness of breath, chest pain-seek care in the Emergency Room as soon as possible. - Actively manage your pain. Managing your pain lets you move in comfort. We will ask you to rate your pain on a scale of zero to 10. It is your responsibility to tell your doctor or nurse where and how much you hurt so your pain can be treated.  Special Considerations -Your final pathology results from surgery should be available around one week after surgery and the results will be relayed to you when available.  -FMLA forms can be faxed to [phone] and please allow 5-7 business days for completion.  Pain Management After Surgery -Make sure that you have Tylenol and Ibuprofen at home IF YOU ARE ABLE TO TAKE THESE MEDICATIONS to use on a regular basis after surgery for pain control. We recommend alternating the medications every hour to six hours since they work differently and are processed in the body differently for pain relief.  -Review the attached handout on narcotic use and their risks and side effects.   Bowel Regimen -It is important to prevent constipation and drink adequate amounts of liquids.   Risks of Surgery Risks of surgery are low but include bleeding, infection, damage to surrounding structures, re-operation,  blood clots, and very rarely death.  AFTER SURGERY INSTRUCTIONS  Return to work:  1-2 days if applicable  Activity: 1. Be up and out of the bed during the day.  Take a nap if needed.  You may walk up steps but be careful and use the hand rail.  Stair climbing will tire you more than you think, you may need to stop part way and rest.   2. No lifting or straining for 1 weeks over 10 pounds. No pushing, pulling, straining for 1 weeks.  3. No driving for minimum 24 hours after surgery.  Do not drive if you are  taking narcotic pain medicine and make sure that your reaction time has returned.   4. You can shower as soon as the next day after surgery. Shower daily. No tub baths or submerging your body in water until cleared by your surgeon for 2 weeks. If you have the soap that was given to you by pre-surgical testing that was used before surgery, you do not need to use it afterwards because this can irritate your incisions.   5. No sexual activity and nothing in the vagina for 2 weeks.  6. You may experience vaginal spotting and discharge after surgery.  The spotting is normal but if you experience heavy bleeding, call our office.  7. Take Tylenol or ibuprofen for pain if you are able to take these medications.  Monitor your Tylenol intake to a max of 4,000 mg in a 24 hour period.   Diet: 1. Low sodium Heart Healthy Diet is recommended but you are cleared to resume your normal (before surgery) diet after your procedure.  2. It is safe to use a laxative, such as Miralax or Colace, if you have difficulty moving your bowels.  Wound Care: 1. Keep clean and dry.  Shower daily.  Reasons to call the Doctor: Fever - Oral temperature greater than 100.4 degrees Fahrenheit Foul-smelling vaginal discharge Difficulty urinating Nausea and vomiting Increased pain at the site of the incision that is unrelieved with pain medicine. Difficulty breathing with or without chest pain New calf pain especially if only on one side Sudden, continuing increased vaginal bleeding with or without clots.   Contacts: For questions or concerns you should contact:  Dr. Eugene Garnet at [phone]  Warner Mccreedy, NP at [phone]  After Hours: call [phone] and have the GYN Oncologist paged/contacted (after 5 pm or on the weekends).  Messages sent via mychart are for non-urgent matters and are not responded to after hours so for urgent needs, please call the after hours number.

## 2022-12-02 NOTE — Progress Notes (Signed)
Patient here for new patient consultation and for a pre-operative appointment prior to her scheduled surgery on 12/17/2022. She is scheduled for examination under anesthesia, hysteroscopy with dilation and curettage of the uterus, Mirena IUD placement, tilt test. The surgery was discussed in detail.  See after visit summary for additional details.    Discussed post-op pain management in detail including the aspects of the enhanced recovery pathway including tylenol and ibuprofen. We discussed the use of tylenol post-op and to monitor for a maximum of 4,000 mg in a 24 hour period.     Discussed measures to take at home to prevent DVT including frequent mobility.  Reportable signs and symptoms of DVT discussed. Post-operative instructions discussed and expectations for after surgery.     10 minutes spent preparing information and with the patient.  Verbalizing understanding of material discussed. No needs or concerns voiced at the end of the visit.   Advised patient to call for any needs.    This appointment is included in the global surgical bundle as pre-operative teaching and has no charge.

## 2022-12-02 NOTE — Patient Instructions (Signed)
 Plan on having a MRI of the pelvis and a CT scan of the abdomen before surgery. For the CT scan, nothing to eat or drink 4 hours before.   Dr. Pricilla Holm recommends the applications for your phone called My fitness pal and Lose it.   Preparing for your Surgery  You will need to hold your ozempic for 7 days before surgery.  Plan for surgery on December 17, 2022 with Dr. Eugene Garnet at [facility]. You will be scheduled for examination under anesthesia, hysteroscopy with dilation and curettage of the uterus, Mirena IUD placement, tilt test to see if lungs can tolerate positioning.   Pre-operative Testing -You will receive a phone call from presurgical testing at [facility] to discuss surgery instructions and arrange for lab work if needed.  -Bring your insurance card, copy of an advanced directive if applicable, medication list.  -You should not be taking blood thinners or aspirin at least ten days prior to surgery unless instructed by your surgeon.  -Do not take supplements such as fish oil (omega 3), red yeast rice, turmeric before your surgery. You want to avoid medications with aspirin in them including headache powders such as BC or Goody's), Excedrin migraine.  Day Before Surgery at Home -You will be advised you can have clear liquids up until 3 hours before your surgery.    Your role in recovery Your role is to become active as soon as directed by your doctor, while still giving yourself time to heal.  Rest when you feel tired. You will be asked to do the following in order to speed your recovery:  - Cough and breathe deeply. This helps to clear and expand your lungs and can prevent pneumonia after surgery.  - STAY ACTIVE WHEN YOU GET HOME. Do mild physical activity. Walking or moving your legs help your circulation and body functions return to normal. Do not try to get up or walk alone the first time after surgery.   -If you develop swelling on one leg or the  other, pain in the back of your leg, redness/warmth in one of your legs, please call the office or go to the Emergency Room to have a doppler to rule out a blood clot. For shortness of breath, chest pain-seek care in the Emergency Room as soon as possible. - Actively manage your pain. Managing your pain lets you move in comfort. We will ask you to rate your pain on a scale of zero to 10. It is your responsibility to tell your doctor or nurse where and how much you hurt so your pain can be treated.  Special Considerations -Your final pathology results from surgery should be available around one week after surgery and the results will be relayed to you when available.  -FMLA forms can be faxed to [phone] and please allow 5-7 business days for completion.  Pain Management After Surgery -Make sure that you have Tylenol and Ibuprofen at home IF YOU ARE ABLE TO TAKE THESE MEDICATIONS to use on a regular basis after surgery for pain control. We recommend alternating the medications every hour to six hours since they work differently and are processed in the body differently for pain relief.  -Review the attached handout on narcotic use and their risks and side effects.   Bowel Regimen -It is important to prevent constipation and drink adequate amounts of liquids.   Risks of Surgery Risks of surgery are low but include bleeding, infection, damage to surrounding structures, re-operation,  blood clots, and very rarely death.  AFTER SURGERY INSTRUCTIONS  Return to work:  1-2 days if applicable  Activity: 1. Be up and out of the bed during the day.  Take a nap if needed.  You may walk up steps but be careful and use the hand rail.  Stair climbing will tire you more than you think, you may need to stop part way and rest.   2. No lifting or straining for 1 weeks over 10 pounds. No pushing, pulling, straining for 1 weeks.  3. No driving for minimum 24 hours after surgery.  Do not drive if you are  taking narcotic pain medicine and make sure that your reaction time has returned.   4. You can shower as soon as the next day after surgery. Shower daily. No tub baths or submerging your body in water until cleared by your surgeon for 2 weeks. If you have the soap that was given to you by pre-surgical testing that was used before surgery, you do not need to use it afterwards because this can irritate your incisions.   5. No sexual activity and nothing in the vagina for 2 weeks.  6. You may experience vaginal spotting and discharge after surgery.  The spotting is normal but if you experience heavy bleeding, call our office.  7. Take Tylenol or ibuprofen for pain if you are able to take these medications.  Monitor your Tylenol intake to a max of 4,000 mg in a 24 hour period.   Diet: 1. Low sodium Heart Healthy Diet is recommended but you are cleared to resume your normal (before surgery) diet after your procedure.  2. It is safe to use a laxative, such as Miralax or Colace, if you have difficulty moving your bowels.  Wound Care: 1. Keep clean and dry.  Shower daily.  Reasons to call the Doctor: Fever - Oral temperature greater than 100.4 degrees Fahrenheit Foul-smelling vaginal discharge Difficulty urinating Nausea and vomiting Increased pain at the site of the incision that is unrelieved with pain medicine. Difficulty breathing with or without chest pain New calf pain especially if only on one side Sudden, continuing increased vaginal bleeding with or without clots.   Contacts: For questions or concerns you should contact:  Dr. Eugene Garnet at [phone]  Warner Mccreedy, NP at [phone]  After Hours: call [phone] and have the GYN Oncologist paged/contacted (after 5 pm or on the weekends).  Messages sent via mychart are for non-urgent matters and are not responded to after hours so for urgent needs, please call the after hours number.

## 2022-12-04 ENCOUNTER — Ambulatory Visit (HOSPITAL_COMMUNITY)
Admission: RE | Admit: 2022-12-04 | Discharge: 2022-12-04 | Disposition: A | Discharge status: Home / Self Care | Payer: Commercial Managed Care - PPO | Source: Ambulatory Visit | Attending: Gynecologic Oncology | Admitting: Gynecologic Oncology

## 2022-12-04 ENCOUNTER — Encounter: Payer: Self-pay | Admitting: Obstetrics and Gynecology

## 2022-12-04 DIAGNOSIS — L299 Pruritus, unspecified: Secondary | ICD-10-CM | POA: Insufficient documentation

## 2022-12-04 DIAGNOSIS — R112 Nausea with vomiting, unspecified: Secondary | ICD-10-CM | POA: Diagnosis present

## 2022-12-04 DIAGNOSIS — C541 Malignant neoplasm of endometrium: Secondary | ICD-10-CM | POA: Insufficient documentation

## 2022-12-04 MED ORDER — IOHEXOL 300 MG/ML  SOLN
100.0000 mL | Freq: Once | INTRAMUSCULAR | Status: AC | PRN
  Administered 2022-12-04: 100 mL via INTRAVENOUS

## 2022-12-04 MED ORDER — IOHEXOL 300 MG/ML  SOLN
30.0000 mL | Freq: Once | INTRAMUSCULAR | Status: AC | PRN
  Administered 2022-12-04: 30 mL via ORAL

## 2022-12-05 ENCOUNTER — Ambulatory Visit: Payer: Commercial Managed Care - PPO | Admitting: Gynecologic Oncology

## 2022-12-05 NOTE — Patient Instructions (Signed)
SURGICAL WAITING ROOM VISITATION  Patients having surgery or a procedure may have no more than 2 support people in the waiting area - these visitors may rotate.    Children under the age of 40 must have an adult with them who is not the patient.  Due to an increase in RSV and influenza rates and associated hospitalizations, children ages 14 and under may not visit patients in [facility] hospitals.  If the patient needs to stay at the hospital during part of their recovery, the visitor guidelines for inpatient rooms apply. Pre-op nurse will coordinate an appropriate time for 1 support person to accompany patient in pre-op.  This support person may not rotate.    Please refer to the [facility] website for the visitor guidelines for Inpatients (after your surgery is over and you are in a regular room).    Your procedure is scheduled on: 12/17/22   Report to [facility] Main Entrance    Report to admitting at 12:00 PM   Call this number if you have problems the morning of surgery [phone]   Do not eat food :After Midnight.   After Midnight you may have the following liquids until 11:15 AM DAY OF SURGERY  Water Non-Citrus Juices (without pulp, NO RED-Apple, White grape, White cranberry) Black Coffee (NO MILK/CREAM OR CREAMERS, sugar ok)  Clear Tea (NO MILK/CREAM OR CREAMERS, sugar ok) regular and decaf                             Plain Jell-O (NO RED)                                           Fruit ices (not with fruit pulp, NO RED)                                     Popsicles (NO RED)                                                               Sports drinks like Gatorade (NO RED)          If you have questions, please contact your surgeon's office.   FOLLOW BOWEL PREP AND ANY ADDITIONAL PRE OP INSTRUCTIONS YOU RECEIVED FROM YOUR SURGEON'S OFFICE!!!     Oral Hygiene is also important to reduce your risk of infection.                                    Remember -  BRUSH YOUR TEETH THE MORNING OF SURGERY WITH YOUR REGULAR TOOTHPASTE  DENTURES WILL BE REMOVED PRIOR TO SURGERY PLEASE DO NOT APPLY "Poly grip" OR ADHESIVES!!!   Do NOT smoke after Midnight   Stop all vitamins and herbal supplements 7 days before surgery.   Take these medicines the morning of surgery with A SIP OF WATER: Albuterol, Lexapro, Flonase, Levothyroxine, Zofran   DO NOT TAKE ANY ORAL DIABETIC MEDICATIONS DAY OF YOUR SURGERY  How  to Manage Your Diabetes Before and After Surgery  Why is it important to control my blood sugar before and after surgery? Improving blood sugar levels before and after surgery helps healing and can limit problems. A way of improving blood sugar control is eating a healthy diet by:  Eating less sugar and carbohydrates  Increasing activity/exercise  Talking with your doctor about reaching your blood sugar goals High blood sugars (greater than 180 mg/dL) can raise your risk of infections and slow your recovery, so you will need to focus on controlling your diabetes during the weeks before surgery. Make sure that the doctor who takes care of your diabetes knows about your planned surgery including the date and location.  How do I manage my blood sugar before surgery? Check your blood sugar at least 4 times a day, starting 2 days before surgery, to make sure that the level is not too high or low. Check your blood sugar the morning of your surgery when you wake up and every 2 hours until you get to the Short Stay unit. If your blood sugar is less than 70 mg/dL, you will need to treat for low blood sugar: Do not take insulin. Treat a low blood sugar (less than 70 mg/dL) with  cup of clear juice (cranberry or apple), 4 glucose tablets, OR glucose gel. Recheck blood sugar in 15 minutes after treatment (to make sure it is greater than 70 mg/dL). If your blood sugar is not greater than 70 mg/dL on recheck, call [phone] for further instructions. Report your  blood sugar to the short stay nurse when you get to Short Stay.  If you are admitted to the hospital after surgery: Your blood sugar will be checked by the staff and you will probably be given insulin after surgery (instead of oral diabetes medicines) to make sure you have good blood sugar levels. The goal for blood sugar control after surgery is 80-180 mg/dL.   WHAT DO I DO ABOUT MY DIABETES MEDICATION?  Do not take oral diabetes medicines (pills) the morning of surgery.  THE DAY BEFORE SURGERY, take     units of       insulin.      THE MORNING OF SURGERY, take   units of         insulin.  DO NOT TAKE THE FOLLOWING 7 DAYS PRIOR TO SURGERY: Ozempic, Wegovy, Rybelsus (Semaglutide), Byetta (exenatide), Bydureon (exenatide ER), Victoza, Saxenda (liraglutide), or Trulicity (dulaglutide) Mounjaro (Tirzepatide) Adlyxin (Lixisenatide), Polyethylene Glycol Loxenatide.  Reviewed and Endorsed by [facility] Patient Education Committee, August 2015  Bring CPAP mask and tubing day of surgery.                              You may not have any metal on your body including hair pins, jewelry, and body piercing             Do not wear make-up, lotions, powders, perfumes, or deodorant  Do not wear nail polish including gel and S&S, artificial/acrylic nails, or any other type of covering on natural nails including finger and toenails. If you have artificial nails, gel coating, etc. that needs to be removed by a nail salon please have this removed prior to surgery or surgery may need to be canceled/ delayed if the surgeon/ anesthesia feels like they are unable to be safely monitored.   Do not shave  48 hours prior to surgery.  Do not bring valuables to the hospital. [facility] IS NOT             RESPONSIBLE   FOR VALUABLES.   Contacts, glasses, dentures or bridgework may not be worn into surgery.  DO NOT BRING YOUR HOME MEDICATIONS TO THE HOSPITAL. PHARMACY WILL DISPENSE MEDICATIONS LISTED ON YOUR  MEDICATION LIST TO YOU DURING YOUR ADMISSION IN THE HOSPITAL!    Patients discharged on the day of surgery will not be allowed to drive home.  Someone NEEDS to stay with you for the first 24 hours after anesthesia.   Special Instructions: Bring a copy of your healthcare power of attorney and living will documents the day of surgery if you haven't scanned them before.              Please read over the following fact sheets you were given: IF YOU HAVE QUESTIONS ABOUT YOUR PRE-OP INSTRUCTIONS PLEASE CALL [phone]    If you received a COVID test during your pre-op visit  it is requested that you wear a mask when out in public, stay away from anyone that may not be feeling well and notify your surgeon if you develop symptoms. If you test positive for Covid or have been in contact with anyone that has tested positive in the last 10 days please notify you surgeon.    [place] - Preparing for Surgery Before surgery, you can play an important role.  Because skin is not sterile, your skin needs to be as free of germs as possible.  You can reduce the number of germs on your skin by washing with CHG (chlorahexidine gluconate) soap before surgery.  CHG is an antiseptic cleaner which kills germs and bonds with the skin to continue killing germs even after washing. Please DO NOT use if you have an allergy to CHG or antibacterial soaps.  If your skin becomes reddened/irritated stop using the CHG and inform your nurse when you arrive at Short Stay. Do not shave (including legs and underarms) for at least 48 hours prior to the first CHG shower.  You may shave your face/neck.  Please follow these instructions carefully:  1.  Shower with CHG Soap the night before surgery and the  morning of surgery.  2.  If you choose to wash your hair, wash your hair first as usual with your normal  shampoo.  3.  After you shampoo, rinse your hair and body thoroughly to remove the shampoo.                              4.  Use CHG as you would any other liquid soap.  You can apply chg directly to the skin and wash.  Gently with a scrungie or clean washcloth.  5.  Apply the CHG Soap to your body ONLY FROM THE NECK DOWN.   Do   not use on face/ open                           Wound or open sores. Avoid contact with eyes, ears mouth and   genitals (private parts).                       Wash face,  Genitals (private parts) with your normal soap.             6.  Wash thoroughly, paying  special attention to the area where your    surgery  will be performed.  7.  Thoroughly rinse your body with warm water from the neck down.  8.  DO NOT shower/wash with your normal soap after using and rinsing off the CHG Soap.                9.  Pat yourself dry with a clean towel.            10.  Wear clean pajamas.            11.  Place clean sheets on your bed the night of your first shower and do not  sleep with pets. Day of Surgery : Do not apply any lotions/deodorants the morning of surgery.  Please wear clean clothes to the hospital/surgery center.  FAILURE TO FOLLOW THESE INSTRUCTIONS MAY RESULT IN THE CANCELLATION OF YOUR SURGERY  PATIENT SIGNATURE_________________________________  NURSE SIGNATURE__________________________________  ________________________________________________________________________

## 2022-12-05 NOTE — Progress Notes (Signed)
COVID Vaccine Completed:  Date of COVID positive in last 90 days:  PCP - Carilyn Goodpasture, NP Cardiologist -   Chest x-ray -  EKG -  Stress Test -  ECHO -  Cardiac Cath -  Pacemaker/ICD device last checked: Spinal Cord Stimulator:  Bowel Prep -   Sleep Study -  CPAP -   Fasting Blood Sugar -  Checks Blood Sugar _____ times a day  Last dose of GLP1 agonist- Ozempic  GLP1 instructions:  Hold 7 days before surgery    Last dose of SGLT-2 inhibitors-  N/A SGLT-2 instructions:  Hold 3 days before surgery    Blood Thinner Instructions:  Time Aspirin Instructions: Last Dose:  Activity level:  Can go up a flight of stairs and perform activities of daily living without stopping and without symptoms of chest pain or shortness of breath.  Able to exercise without symptoms  Unable to go up a flight of stairs without symptoms of     Anesthesia review:   Patient denies shortness of breath, fever, cough and chest pain at PAT appointment  Patient verbalized understanding of instructions that were given to them at the PAT appointment. Patient was also instructed that they will need to review over the PAT instructions again at home before surgery.

## 2022-12-08 ENCOUNTER — Encounter (HOSPITAL_COMMUNITY)
Admission: RE | Admit: 2022-12-08 | Discharge: 2022-12-08 | Disposition: A | Discharge status: Home / Self Care | Payer: Commercial Managed Care - PPO | Source: Ambulatory Visit | Attending: Gynecologic Oncology | Admitting: Gynecologic Oncology

## 2022-12-08 ENCOUNTER — Encounter (HOSPITAL_COMMUNITY): Payer: Self-pay

## 2022-12-08 DIAGNOSIS — E119 Type 2 diabetes mellitus without complications: Secondary | ICD-10-CM

## 2022-12-08 NOTE — Progress Notes (Signed)
Patient missed appointment, says she was not feeling well. Appointment rescheduled for 12/09/22 at 1400

## 2022-12-09 ENCOUNTER — Ambulatory Visit (HOSPITAL_COMMUNITY)
Admission: RE | Admit: 2022-12-09 | Discharge: 2022-12-09 | Disposition: A | Discharge status: Home / Self Care | Payer: Commercial Managed Care - PPO | Source: Ambulatory Visit | Attending: Gynecologic Oncology | Admitting: Gynecologic Oncology

## 2022-12-09 ENCOUNTER — Other Ambulatory Visit: Payer: Self-pay

## 2022-12-09 ENCOUNTER — Encounter (HOSPITAL_COMMUNITY)
Admission: RE | Admit: 2022-12-09 | Discharge: 2022-12-09 | Disposition: A | Discharge status: Home / Self Care | Payer: Commercial Managed Care - PPO | Source: Ambulatory Visit | Attending: Gynecologic Oncology | Admitting: Gynecologic Oncology

## 2022-12-09 ENCOUNTER — Encounter (HOSPITAL_COMMUNITY): Payer: Self-pay | Admitting: *Deleted

## 2022-12-09 DIAGNOSIS — C541 Malignant neoplasm of endometrium: Secondary | ICD-10-CM | POA: Diagnosis present

## 2022-12-09 DIAGNOSIS — E119 Type 2 diabetes mellitus without complications: Secondary | ICD-10-CM

## 2022-12-09 HISTORY — DX: Depression, unspecified: F32.A

## 2022-12-09 HISTORY — DX: Personal history of other diseases of the nervous system and sense organs: Z86.69

## 2022-12-09 HISTORY — DX: Essential (primary) hypertension: I10

## 2022-12-09 LAB — HEMOGLOBIN A1C
Hgb A1c MFr Bld: 12 % — ABNORMAL HIGH (ref 4.8–5.6)
Mean Plasma Glucose: 297.7 mg/dL

## 2022-12-09 LAB — CBC
HCT: 42.8 % (ref 36.0–46.0)
Hemoglobin: 14.2 g/dL (ref 12.0–15.0)
MCH: 30.4 pg (ref 26.0–34.0)
MCHC: 33.2 g/dL (ref 30.0–36.0)
MCV: 91.6 fL (ref 80.0–100.0)
Platelets: 260 10*3/uL (ref 150–400)
RBC: 4.67 MIL/uL (ref 3.87–5.11)
RDW: 13.1 % (ref 11.5–15.5)
WBC: 9.2 10*3/uL (ref 4.0–10.5)
nRBC: 0 % (ref 0.0–0.2)

## 2022-12-09 LAB — BASIC METABOLIC PANEL
Anion gap: 16 — ABNORMAL HIGH (ref 5–15)
BUN: 17 mg/dL (ref 6–20)
CO2: 27 mmol/L (ref 22–32)
Calcium: 10.3 mg/dL (ref 8.9–10.3)
Chloride: 101 mmol/L (ref 98–111)
Creatinine, Ser: 0.81 mg/dL (ref 0.44–1.00)
GFR, Estimated: >60 mL/min (ref >60)
Glucose, Bld: 154 mg/dL — ABNORMAL HIGH (ref 70–99)
Potassium: 4.9 mmol/L (ref 3.5–5.1)
Sodium: 144 mmol/L (ref 135–145)

## 2022-12-09 LAB — GLUCOSE, CAPILLARY: Glucose-Capillary: 171 mg/dL — ABNORMAL HIGH (ref 70–99)

## 2022-12-09 MED ORDER — GADOBUTROL 1 MMOL/ML IV SOLN
10.0000 mL | Freq: Once | INTRAVENOUS | Status: AC | PRN
  Administered 2022-12-09: 10 mL via INTRAVENOUS

## 2022-12-09 NOTE — Patient Instructions (Addendum)
SURGICAL WAITING ROOM VISITATION Patients having surgery or a procedure may have no more than 2 support people in the waiting area - these visitors may rotate.    Children under the age of 42 must have an adult with them who is not the patient.  If the patient needs to stay at the hospital during part of their recovery, the visitor guidelines for inpatient rooms apply. Pre-op nurse will coordinate an appropriate time for 1 support person to accompany patient in pre-op.  This support person may not rotate.    Please refer to the [facility] website for the visitor guidelines for Inpatients (after your surgery is over and you are in a regular room).       Your procedure is scheduled on: 12-17-22   Report to [facility] Main Entrance    Report to admitting at 12:00 PM   Call this number if you have problems the morning of surgery [phone]   Do not eat food :After Midnight.   After Midnight you may have the following liquids until 11:15 AM DAY OF SURGERY  Water Non-Citrus Juices (without pulp, NO RED-Apple, White grape, White cranberry) Black Coffee (NO MILK/CREAM OR CREAMERS, sugar ok)  Clear Tea (NO MILK/CREAM OR CREAMERS, sugar ok) regular and decaf                             Plain Jell-O (NO RED)                                           Fruit ices (not with fruit pulp, NO RED)                                     Popsicles (NO RED)                                                               Sports drinks like Gatorade (NO RED)         If you have questions, please contact your surgeon's office.  FOLLOW  ANY ADDITIONAL PRE OP INSTRUCTIONS YOU RECEIVED FROM YOUR SURGEON'S OFFICE!!!     Oral Hygiene is also important to reduce your risk of infection.                                    Remember - BRUSH YOUR TEETH THE MORNING OF SURGERY WITH YOUR REGULAR TOOTHPASTE   Do NOT smoke after Midnight   Take these medicines the morning of surgery with A SIP OF WATER:    Escitalopram  Allegra  Levothyroxine  Okay to use inhalers  Ondansetron if needed  Stop all vitamins and herbal supplements 7 days before surgery  How to Manage Your Diabetes Before and After Surgery  Why is it important to control my blood sugar before and after surgery? Improving blood sugar levels before and after surgery helps healing and can limit problems. A way of improving blood sugar control is eating a healthy  diet by:  Eating less sugar and carbohydrates  Increasing activity/exercise  Talking with your doctor about reaching your blood sugar goals High blood sugars (greater than 180 mg/dL) can raise your risk of infections and slow your recovery, so you will need to focus on controlling your diabetes during the weeks before surgery. Make sure that the doctor who takes care of your diabetes knows about your planned surgery including the date and location.  How do I manage my blood sugar before surgery? Check your blood sugar at least 4 times a day, starting 2 days before surgery, to make sure that the level is not too high or low. Check your blood sugar the morning of your surgery when you wake up and every 2 hours until you get to the Short Stay unit. If your blood sugar is less than 70 mg/dL, you will need to treat for low blood sugar: Do not take insulin. Treat a low blood sugar (less than 70 mg/dL) with  cup of clear juice (cranberry or apple), 4 glucose tablets, OR glucose gel. Recheck blood sugar in 15 minutes after treatment (to make sure it is greater than 70 mg/dL). If your blood sugar is not greater than 70 mg/dL on recheck, call [phone] for further instructions. Report your blood sugar to the short stay nurse when you get to Short Stay.  If you are admitted to the hospital after surgery: Your blood sugar will be checked by the staff and you will probably be given insulin after surgery (instead of oral diabetes medicines) to make sure you have good blood  sugar levels. The goal for blood sugar control after surgery is 80-180 mg/dL.   WHAT DO I DO ABOUT MY DIABETES MEDICATION?  Do not take oral diabetes medicines (pills) the morning of surgery.  Evaristo Bury do 1/2 dose  the morning of surgery    DO NOT TAKE THE FOLLOWING 7 DAYS PRIOR TO SURGERY: Ozempic, Wegovy, Rybelsus (Semaglutide), Byetta (exenatide), Bydureon (exenatide ER), Victoza, Saxenda (liraglutide), or Trulicity (dulaglutide) Mounjaro (Tirzepatide) Adlyxin (Lixisenatide), Polyethylene Glycol Loxenatide.  Reviewed and Endorsed by [facility] Patient Education Committee, August 2015  Bring CPAP mask and tubing day of surgery.                              You may not have any metal on your body including hair pins, jewelry, and body piercing             Do not wear make-up, lotions, powders, perfumes, or deodorant  Do not wear nail polish including gel and S&S, artificial/acrylic nails, or any other type of covering on natural nails including finger and toenails. If you have artificial nails, gel coating, etc. that needs to be removed by a nail salon please have this removed prior to surgery or surgery may need to be canceled/ delayed if the surgeon/ anesthesia feels like they are unable to be safely monitored.   Do not shave  48 hours prior to surgery.       Do not bring valuables to the hospital. [facility] IS NOT RESPONSIBLE   FOR VALUABLES.   Contacts, dentures or bridgework may not be worn into surgery.  DO NOT BRING YOUR HOME MEDICATIONS TO THE HOSPITAL. PHARMACY WILL DISPENSE MEDICATIONS LISTED ON YOUR MEDICATION LIST TO YOU DURING YOUR ADMISSION IN THE HOSPITAL!    Patients discharged on the day of surgery will not be allowed to drive home.  Someone NEEDS to stay with you for the first 24 hours after anesthesia.               Please read over the following fact sheets you were given: IF YOU HAVE QUESTIONS ABOUT YOUR PRE-OP INSTRUCTIONS PLEASE CALL [phone] G[facility]  If  you received a COVID test during your pre-op visit  it is requested that you wear a mask when out in public, stay away from anyone that may not be feeling well and notify your surgeon if you develop symptoms. If you test positive for Covid or have been in contact with anyone that has tested positive in the last 10 days please notify you surgeon.   - Preparing for Surgery Before surgery, you can play an important role.  Because skin is not sterile, your skin needs to be as free of germs as possible.  You can reduce the number of germs on your skin by washing with CHG (chlorahexidine gluconate) soap before surgery.  CHG is an antiseptic cleaner which kills germs and bonds with the skin to continue killing germs even after washing. Please DO NOT use if you have an allergy to CHG or antibacterial soaps.  If your skin becomes reddened/irritated stop using the CHG and inform your nurse when you arrive at Short Stay. Do not shave (including legs and underarms) for at least 48 hours prior to the first CHG shower.  You may shave your face/neck.  Please follow these instructions carefully:  1.  Shower with CHG Soap the night before surgery and the  morning of surgery.  2.  If you choose to wash your hair, wash your hair first as usual with your normal  shampoo.  3.  After you shampoo, rinse your hair and body thoroughly to remove the shampoo.                             4.  Use CHG as you would any other liquid soap.  You can apply chg directly to the skin and wash.  Gently with a scrungie or clean washcloth.  5.  Apply the CHG Soap to your body ONLY FROM THE NECK DOWN.   Do   not use on face/ open                           Wound or open sores. Avoid contact with eyes, ears mouth and   genitals (private parts).                       Wash face,  Genitals (private parts) with your normal soap.             6.  Wash thoroughly, paying special attention to the area where your    surgery  will be  performed.  7.  Thoroughly rinse your body with warm water from the neck down.  8.  DO NOT shower/wash with your normal soap after using and rinsing off the CHG Soap.                9.  Pat yourself dry with a clean towel.            10.  Wear clean pajamas.            11.  Place clean sheets on your bed the night of your first shower and do not  sleep with pets. Day of Surgery : Do not apply any lotions/deodorants the morning of surgery.  Please wear clean clothes to the hospital/surgery center.  FAILURE TO FOLLOW THESE INSTRUCTIONS MAY RESULT IN THE CANCELLATION OF YOUR SURGERY  PATIENT SIGNATURE_________________________________  NURSE SIGNATURE__________________________________  ________________________________________________________________________

## 2022-12-09 NOTE — Progress Notes (Signed)
COVID Vaccine Completed:  Date of COVID positive in last 90 days:  No  PCP - Carilyn Goodpasture, NP Cardiologist - N/A  Chest x-ray -  N/A EKG - 12-09-22 Epic Stress Test -  N/A ECHO -  N/A Cardiac Cath -  N/A Pacemaker/ICD device last checked: Spinal Cord Stimulator: N/A  Bowel Prep -  N/A  Sleep Study - Yes, +sleep apnea CPAP - Yes  Freestyle Libre L arm Fasting Blood Sugar - 220 to 250 Checks Blood Sugar -  times a day  Ozempic (no longer taking) Last dose of GLP1 agonist-   GLP1 instructions:  Hold 7 days before surgery    Last dose of SGLT-2 inhibitors-  N/A SGLT-2 instructions:  Hold 3 days before surgery   Blood Thinner Instructions:  N/A Aspirin Instructions: Last Dose:  Activity level:  Can go up a flight of stairs and perform activities of daily living without stopping and without symptoms of chest pain or shortness of breath.  Anesthesia review:  N/A  Patient denies shortness of breath, fever, cough and chest pain at PAT appointment  Patient verbalized understanding of instructions that were given to them at the PAT appointment. Patient was also instructed that they will need to review over the PAT instructions again at home before surgery.

## 2022-12-16 ENCOUNTER — Telehealth: Payer: Self-pay | Admitting: *Deleted

## 2022-12-16 NOTE — Telephone Encounter (Signed)
Telephone call to check on pre-operative status.  Patient compliant with pre-operative instructions.  Reinforced nothing to eat after midnight. Clear liquids until 11:00. Patient to arrive at 12:00.  No questions or concerns voiced.  Instructed to call for any needs.

## 2022-12-17 ENCOUNTER — Ambulatory Visit (HOSPITAL_COMMUNITY): Payer: Commercial Managed Care - PPO | Admitting: Physician Assistant

## 2022-12-17 ENCOUNTER — Encounter (HOSPITAL_COMMUNITY)
Admission: RE | Disposition: A | Discharge status: Home / Self Care | Payer: Self-pay | Source: Home / Self Care | Attending: Gynecologic Oncology

## 2022-12-17 ENCOUNTER — Other Ambulatory Visit: Payer: Self-pay

## 2022-12-17 ENCOUNTER — Ambulatory Visit (HOSPITAL_COMMUNITY)
Admission: RE | Admit: 2022-12-17 | Discharge: 2022-12-17 | Disposition: A | Discharge status: Home / Self Care | Payer: Commercial Managed Care - PPO | Attending: Gynecologic Oncology | Admitting: Gynecologic Oncology

## 2022-12-17 ENCOUNTER — Ambulatory Visit (HOSPITAL_BASED_OUTPATIENT_CLINIC_OR_DEPARTMENT_OTHER): Payer: Commercial Managed Care - PPO | Admitting: Anesthesiology

## 2022-12-17 ENCOUNTER — Encounter (HOSPITAL_COMMUNITY): Payer: Self-pay | Admitting: Gynecologic Oncology

## 2022-12-17 DIAGNOSIS — J45909 Unspecified asthma, uncomplicated: Secondary | ICD-10-CM | POA: Insufficient documentation

## 2022-12-17 DIAGNOSIS — C541 Malignant neoplasm of endometrium: Secondary | ICD-10-CM | POA: Diagnosis present

## 2022-12-17 DIAGNOSIS — Z7985 Long-term (current) use of injectable non-insulin antidiabetic drugs: Secondary | ICD-10-CM | POA: Insufficient documentation

## 2022-12-17 DIAGNOSIS — E039 Hypothyroidism, unspecified: Secondary | ICD-10-CM | POA: Diagnosis not present

## 2022-12-17 DIAGNOSIS — E1165 Type 2 diabetes mellitus with hyperglycemia: Secondary | ICD-10-CM | POA: Diagnosis not present

## 2022-12-17 DIAGNOSIS — F419 Anxiety disorder, unspecified: Secondary | ICD-10-CM | POA: Insufficient documentation

## 2022-12-17 DIAGNOSIS — Z7984 Long term (current) use of oral hypoglycemic drugs: Secondary | ICD-10-CM | POA: Insufficient documentation

## 2022-12-17 DIAGNOSIS — Z30430 Encounter for insertion of intrauterine contraceptive device: Secondary | ICD-10-CM | POA: Diagnosis not present

## 2022-12-17 DIAGNOSIS — Z79890 Hormone replacement therapy (postmenopausal): Secondary | ICD-10-CM | POA: Insufficient documentation

## 2022-12-17 DIAGNOSIS — I10 Essential (primary) hypertension: Secondary | ICD-10-CM | POA: Diagnosis not present

## 2022-12-17 DIAGNOSIS — Z79899 Other long term (current) drug therapy: Secondary | ICD-10-CM | POA: Insufficient documentation

## 2022-12-17 DIAGNOSIS — E119 Type 2 diabetes mellitus without complications: Secondary | ICD-10-CM

## 2022-12-17 DIAGNOSIS — G4733 Obstructive sleep apnea (adult) (pediatric): Secondary | ICD-10-CM | POA: Insufficient documentation

## 2022-12-17 HISTORY — PX: INTRAUTERINE DEVICE (IUD) INSERTION: SHX5877

## 2022-12-17 HISTORY — PX: DILATATION & CURETTAGE/HYSTEROSCOPY WITH MYOSURE: SHX6511

## 2022-12-17 LAB — GLUCOSE, CAPILLARY
Glucose-Capillary: 143 mg/dL — ABNORMAL HIGH (ref 70–99)
Glucose-Capillary: 112 mg/dL — ABNORMAL HIGH (ref 70–99)

## 2022-12-17 SURGERY — DILATATION & CURETTAGE/HYSTEROSCOPY WITH MYOSURE
Anesthesia: General

## 2022-12-17 MED ORDER — ONDANSETRON HCL 4 MG/2ML IJ SOLN
INTRAMUSCULAR | Status: DC | PRN
  Administered 2022-12-17: 4 mg via INTRAVENOUS

## 2022-12-17 MED ORDER — ONDANSETRON HCL 4 MG/2ML IJ SOLN
INTRAMUSCULAR | Status: AC
  Filled 2022-12-17: qty 2

## 2022-12-17 MED ORDER — CHLORHEXIDINE GLUCONATE 0.12 % MT SOLN
15.0000 mL | Freq: Once | OROMUCOSAL | Status: AC
  Administered 2022-12-17: 15 mL via OROMUCOSAL

## 2022-12-17 MED ORDER — LIDOCAINE HCL 1 % IJ SOLN
INTRAMUSCULAR | Status: DC | PRN
  Administered 2022-12-17: 10 mL

## 2022-12-17 MED ORDER — SUGAMMADEX SODIUM 200 MG/2ML IV SOLN
INTRAVENOUS | Status: DC | PRN
  Administered 2022-12-17: 400 mg via INTRAVENOUS

## 2022-12-17 MED ORDER — DEXAMETHASONE SODIUM PHOSPHATE 10 MG/ML IJ SOLN
INTRAMUSCULAR | Status: AC
  Filled 2022-12-17: qty 1

## 2022-12-17 MED ORDER — PROPOFOL 10 MG/ML IV BOLUS
INTRAVENOUS | Status: AC
  Filled 2022-12-17: qty 20

## 2022-12-17 MED ORDER — PROPOFOL 10 MG/ML IV BOLUS
INTRAVENOUS | Status: DC | PRN
  Administered 2022-12-17: 200 mg via INTRAVENOUS

## 2022-12-17 MED ORDER — KETOROLAC TROMETHAMINE 30 MG/ML IJ SOLN: 30.0000 mg | Freq: Once | INTRAMUSCULAR | Status: DC | PRN

## 2022-12-17 MED ORDER — LIDOCAINE HCL (PF) 1 % IJ SOLN
INTRAMUSCULAR | Status: AC
  Filled 2022-12-17: qty 30

## 2022-12-17 MED ORDER — ROCURONIUM BROMIDE 10 MG/ML (PF) SYRINGE
PREFILLED_SYRINGE | INTRAVENOUS | Status: AC
  Filled 2022-12-17: qty 10

## 2022-12-17 MED ORDER — LEVONORGESTREL 20 MCG/DAY IU IUD
1.0000 | INTRAUTERINE_SYSTEM | INTRAUTERINE | Status: DC
  Filled 2022-12-17: qty 1

## 2022-12-17 MED ORDER — LACTATED RINGERS IV SOLN: INTRAVENOUS | Status: DC | PRN

## 2022-12-17 MED ORDER — MIDAZOLAM HCL 2 MG/2ML IJ SOLN
INTRAMUSCULAR | Status: AC
Start: 2022-12-17 — End: ?
  Filled 2022-12-17: qty 2

## 2022-12-17 MED ORDER — INSULIN ASPART 100 UNIT/ML IJ SOLN: 0.0000 [IU] | INTRAMUSCULAR | Status: DC | PRN

## 2022-12-17 MED ORDER — DEXAMETHASONE SODIUM PHOSPHATE 4 MG/ML IJ SOLN: 4.0000 mg | INTRAMUSCULAR | Status: DC

## 2022-12-17 MED ORDER — ROCURONIUM BROMIDE 100 MG/10ML IV SOLN
INTRAVENOUS | Status: DC | PRN
  Administered 2022-12-17: 70 mg via INTRAVENOUS

## 2022-12-17 MED ORDER — FENTANYL CITRATE PF 50 MCG/ML IJ SOSY: 25.0000 ug | PREFILLED_SYRINGE | INTRAMUSCULAR | Status: DC | PRN

## 2022-12-17 MED ORDER — MIDAZOLAM HCL 5 MG/5ML IJ SOLN
INTRAMUSCULAR | Status: DC | PRN
  Administered 2022-12-17: 2 mg via INTRAVENOUS

## 2022-12-17 MED ORDER — SODIUM CHLORIDE 0.9 % IR SOLN
Status: DC | PRN
  Administered 2022-12-17: 3000 mL

## 2022-12-17 MED ORDER — FENTANYL CITRATE (PF) 100 MCG/2ML IJ SOLN
INTRAMUSCULAR | Status: DC | PRN
  Administered 2022-12-17: 100 ug via INTRAVENOUS

## 2022-12-17 MED ORDER — FENTANYL CITRATE (PF) 100 MCG/2ML IJ SOLN
INTRAMUSCULAR | Status: AC
  Filled 2022-12-17: qty 2

## 2022-12-17 MED ORDER — OXYCODONE HCL 5 MG/5ML PO SOLN: 5.0000 mg | Freq: Once | ORAL | Status: DC | PRN

## 2022-12-17 MED ORDER — LIDOCAINE HCL (PF) 2 % IJ SOLN
INTRAMUSCULAR | Status: AC
  Filled 2022-12-17: qty 5

## 2022-12-17 MED ORDER — ONDANSETRON HCL 4 MG/2ML IJ SOLN: 4.0000 mg | Freq: Once | INTRAMUSCULAR | Status: DC | PRN

## 2022-12-17 MED ORDER — DEXAMETHASONE SODIUM PHOSPHATE 10 MG/ML IJ SOLN
INTRAMUSCULAR | Status: DC | PRN
  Administered 2022-12-17: 8 mg via INTRAVENOUS

## 2022-12-17 MED ORDER — OXYCODONE HCL 5 MG PO TABS: 5.0000 mg | ORAL_TABLET | Freq: Once | ORAL | Status: DC | PRN

## 2022-12-17 MED ORDER — ACETAMINOPHEN 500 MG PO TABS
1000.0000 mg | ORAL_TABLET | ORAL | Status: AC
  Administered 2022-12-17: 1000 mg via ORAL
  Filled 2022-12-17: qty 2

## 2022-12-17 MED ORDER — LIDOCAINE HCL (CARDIAC) PF 100 MG/5ML IV SOSY
PREFILLED_SYRINGE | INTRAVENOUS | Status: DC | PRN
  Administered 2022-12-17: 100 mg via INTRAVENOUS

## 2022-12-17 MED ORDER — ORAL CARE MOUTH RINSE: 15.0000 mL | Freq: Once | OROMUCOSAL | Status: AC

## 2022-12-17 MED ORDER — LACTATED RINGERS IV SOLN: INTRAVENOUS | Status: DC

## 2022-12-17 MED ORDER — SCOPOLAMINE 1 MG/3DAYS TD PT72
1.0000 | MEDICATED_PATCH | TRANSDERMAL | Status: DC
  Administered 2022-12-17: 1.5 mg via TRANSDERMAL
  Filled 2022-12-17: qty 1

## 2022-12-17 SURGICAL SUPPLY — 21 items
BAG COUNTER SPONGE SURGICOUNT (BAG) ×2 IMPLANT
DEVICE MYOSURE LITE (MISCELLANEOUS) IMPLANT
DEVICE MYOSURE REACH (MISCELLANEOUS) IMPLANT
DILATOR CANAL MILEX (MISCELLANEOUS) IMPLANT
GAUZE 4X4 16PLY ~~LOC~~+RFID DBL (SPONGE) IMPLANT
GLOVE BIO SURGEON STRL SZ 6 (GLOVE) ×2 IMPLANT
GLOVE BIO SURGEON STRL SZ 6.5 (GLOVE) IMPLANT
GOWN STRL REUS W/ TWL LRG LVL3 (GOWN DISPOSABLE) ×2 IMPLANT
IV NS IRRIG 3000ML ARTHROMATIC (IV SOLUTION) ×2 IMPLANT
KIT PROCEDURE FLUENT (KITS) IMPLANT
KIT TURNOVER KIT A (KITS) IMPLANT
LOOP CUTTING BIPOLAR 21FR (ELECTRODE) IMPLANT
MYOSURE XL FIBROID (MISCELLANEOUS)
PACK VAGINAL WOMENS (CUSTOM PROCEDURE TRAY) ×2 IMPLANT
PAD OB MATERNITY 4.3X12.25 (PERSONAL CARE ITEMS) IMPLANT
SEAL ROD LENS SCOPE MYOSURE (ABLATOR) IMPLANT
SYS MIRENA INTRAUTERINE (MISCELLANEOUS) ×1
SYSTEM MIRENA INTRAUTERINE (MISCELLANEOUS) IMPLANT
SYSTEM TISS REMOVAL MYOSURE XL (MISCELLANEOUS) IMPLANT
TOWEL OR 17X26 10 PK STRL BLUE (TOWEL DISPOSABLE) ×2 IMPLANT
WATER STERILE IRR 500ML POUR (IV SOLUTION) ×2 IMPLANT

## 2022-12-17 NOTE — Op Note (Addendum)
OPERATIVE NOTE  PATIENT: Amanda Spence DATE: 12/17/22  Preop Diagnosis: Low grade endometrioid endometrial adenocarcinoma, poorly controlled T2DM  Postoperative Diagnosis: same as above  Surgery: Hysteroscopy with endometrial sampling using Myosure device, Tilt test, Mirena IUD insertion (Lot #[id number], exp 11/2024)  Surgeons:  Eugene Garnet MD  Assistant: none  Anesthesia: General   Estimated blood loss: 100 ml  IVF:  see I&O flowsheet   Urine output: unmeasured  Complications: None apparent  Pathology: endometrial curetteings  Operative findings: On EUA, narrow vagina, mobile uterus, palpably smooth cervix. On speculum exam, normal appearing cervix. Uterus sounded to 10 cm. On hysteroscopy, some polypoid tissue within the upper endocervical canal. Endometrial cavity diffusely filled with polypoid and more leiomyoma appearing lesions some with prominent vascularity. Unable to appreciate either ostia.  Procedure: The patient was identified in the preoperative holding area. Informed consent was signed on the chart. Patient was seen history was reviewed and exam was performed.   The patient was then taken to the operating room and placed in the supine position with SCD hose on. General anesthesia was then induced without difficulty. She was then placed in the dorsolithotomy position. The perineum was prepped with Betadine. The vagina was prepped with Betadine. The patient was then draped after the prep was dried.   Timeout was performed the patient, procedure, antibiotic, allergy, and length of procedure.   The weighted speculum was placed in the posterior vagina. The single tooth tenaculum was placed on the anterior lip of the cervix. The uterine sound was placed in the cervix and advanced to the fundus. The cervix was successively dilated using pratts dilators to 17F.  The Myosure hysteroscopy was advanced to the fundus with findings as above. Some of the polypoid  endometrial and high endocervical tissue was resected. Once hysteroscope removed, Mirena IUD was inserted to the uterine fundus, deployed, and inserter removed. Strings were cut at 4 cm.   The specimen was collected in the specimen sock and sent for permanent pathology.  10cc of 1% lidocaine was injected as a paracervical block at 4 and 8 o'clock. The tenaculum was removed and hemostasis was observed. The vagina was irrigated.  All instrument, suture, laparotomy, Ray-Tec, and needle counts were correct x2. The patient tolerated the procedure well and was taken recovery room in stable condition.   Carver Fila, MD

## 2022-12-17 NOTE — Discharge Instructions (Addendum)
AFTER SURGERY INSTRUCTIONS   Return to work:  1-2 days if applicable   Activity: 1. Be up and out of the bed during the day.  Take a nap if needed.  You may walk up steps but be careful and use the hand rail.  Stair climbing will tire you more than you think, you may need to stop part way and rest.    2. No lifting or straining for 1 weeks over 10 pounds. No pushing, pulling, straining for 1 weeks.   3. No driving for minimum 24 hours after surgery.  Do not drive if you are taking narcotic pain medicine and make sure that your reaction time has returned.    4. You can shower as soon as the next day after surgery. Shower daily. No tub baths or submerging your body in water until cleared by your surgeon for 2 weeks. If you have the soap that was given to you by pre-surgical testing that was used before surgery, you do not need to use it afterwards because this can irritate your incisions.    5. No sexual activity and nothing in the vagina for 2 weeks.   6. You may experience vaginal spotting and discharge after surgery.  The spotting is normal but if you experience heavy bleeding, call our office.   7. Take Tylenol or ibuprofen for pain if you are able to take these medications.  Monitor your Tylenol intake to a max of 4,000 mg in a 24 hour period.    Diet: 1. Low sodium Heart Healthy Diet is recommended but you are cleared to resume your normal (before surgery) diet after your procedure.   2. It is safe to use a laxative, such as Miralax or Colace, if you have difficulty moving your bowels.   Wound Care: 1. Keep clean and dry.  Shower daily.   Reasons to call the Doctor: Fever - Oral temperature greater than 100.4 degrees Fahrenheit Foul-smelling vaginal discharge Difficulty urinating Nausea and vomiting Increased pain at the site of the incision that is unrelieved with pain medicine. Difficulty breathing with or without chest pain New calf pain especially if only on one  side Sudden, continuing increased vaginal bleeding with or without clots.   Contacts: For questions or concerns you should contact:   Dr. Eugene Garnet at [phone]   Warner Mccreedy, NP at [phone]   After Hours: call [phone] and have the GYN Oncologist paged/contacted (after 5 pm or on the weekends).   Messages sent via mychart are for non-urgent matters and are not responded to after hours so for urgent needs, please call the after hours number.

## 2022-12-17 NOTE — Anesthesia Procedure Notes (Signed)
Procedure Name: Intubation Date/Time: 12/17/2022 2:37 PM  Performed by: Jamelle Rushing, CRNAPre-anesthesia Checklist: Patient identified, Emergency Drugs available, Suction available, Patient being monitored and Timeout performed Patient Re-evaluated:Patient Re-evaluated prior to induction Oxygen Delivery Method: Circle system utilized Preoxygenation: Pre-oxygenation with 100% oxygen Induction Type: IV induction Ventilation: Mask ventilation without difficulty Laryngoscope Size: Mac and 3 Grade View: Grade I Tube type: Oral Tube size: 7.0 mm Number of attempts: 1 Airway Equipment and Method: Stylet Placement Confirmation: ETT inserted through vocal cords under direct vision, positive ETCO2 and breath sounds checked- equal and bilateral Secured at: 20 cm Tube secured with: Tape Dental Injury: Teeth and Oropharynx as per pre-operative assessment

## 2022-12-17 NOTE — Transfer of Care (Signed)
Immediate Anesthesia Transfer of Care Note  Patient: Amanda Spence  Procedure(s) Performed: DILATATION & CURETTAGE/HYSTEROSCOPY WITH MYOSURE INTRAUTERINE DEVICE (IUD) INSERTION MIRENA, TILT TEST  Patient Location: PACU  Anesthesia Type:General  Level of Consciousness: drowsy  Airway & Oxygen Therapy: Patient Spontanous Breathing and Patient connected to face mask oxygen  Post-op Assessment: Report given to RN and Post -op Vital signs reviewed and stable  Post vital signs: Reviewed and stable  Last Vitals:  Vitals Value Taken Time  BP 142/85 12/17/22 1522  Temp    Pulse 84 12/17/22 1525  Resp 18 12/17/22 1525  SpO2 99 % 12/17/22 1525  Vitals shown include unfiled device data.  Last Pain:  Vitals:   12/17/22 1222  TempSrc: Oral  PainSc:          Complications: No notable events documented.

## 2022-12-17 NOTE — Interval H&P Note (Signed)
History and Physical Interval Note:  12/17/2022 2:19 PM  Amanda Spence  has presented today for surgery, with the diagnosis of ENDOMETRIAL CANCER.  The various methods of treatment have been discussed with the patient and family. After consideration of risks, benefits and other options for treatment, the patient has consented to  Procedure(s): DILATATION & CURETTAGE/HYSTEROSCOPY WITH MYOSURE (N/A) INTRAUTERINE DEVICE (IUD) INSERTION MIRENA, TILT TEST (N/A) as a surgical intervention.  The patient's history has been reviewed, patient examined, no change in status, stable for surgery.  I have reviewed the patient's chart and labs.  Questions were answered to the patient's satisfaction.     Carver Fila

## 2022-12-17 NOTE — Anesthesia Postprocedure Evaluation (Signed)
Anesthesia Post Note  Patient: Amanda Spence  Procedure(s) Performed: DILATATION & CURETTAGE/HYSTEROSCOPY WITH MYOSURE INTRAUTERINE DEVICE (IUD) INSERTION MIRENA, TILT TEST     Patient location during evaluation: PACU Anesthesia Type: General Level of consciousness: awake and alert and oriented Pain management: pain level controlled Vital Signs Assessment: post-procedure vital signs reviewed and stable Respiratory status: spontaneous breathing, nonlabored ventilation and respiratory function stable Cardiovascular status: blood pressure returned to baseline and stable Postop Assessment: no apparent nausea or vomiting Anesthetic complications: no   No notable events documented.  Last Vitals:  Vitals:   12/17/22 1545 12/17/22 1602  BP: 129/77   Pulse: 86 88  Resp: 15   Temp:    SpO2: 94% 95%    Last Pain:  Vitals:   12/17/22 1602  TempSrc:   PainSc: 0-No pain                 Dameisha Tschida A.

## 2022-12-17 NOTE — Anesthesia Preprocedure Evaluation (Addendum)
Anesthesia Evaluation  Patient identified by MRN, date of birth, ID band Patient awake    Reviewed: Allergy & Precautions, H&P , NPO status , Patient's Chart, lab work & pertinent test results  Airway Mallampati: III  TM Distance: <3 FB Neck ROM: Full    Dental no notable dental hx.    Pulmonary asthma , sleep apnea    Pulmonary exam normal breath sounds clear to auscultation       Cardiovascular hypertension, Pt. on medications Normal cardiovascular exam Rhythm:Regular Rate:Normal     Neuro/Psych negative neurological ROS  negative psych ROS   GI/Hepatic negative GI ROS, Neg liver ROS,,,  Endo/Other  diabetesHypothyroidism  Class 4 obesity  Renal/GU negative Renal ROS  negative genitourinary   Musculoskeletal negative musculoskeletal ROS (+)    Abdominal   Peds negative pediatric ROS (+)  Hematology negative hematology ROS (+)   Anesthesia Other Findings   Reproductive/Obstetrics negative OB ROS                             Anesthesia Physical Anesthesia Plan  ASA: 3  Anesthesia Plan: General   Post-op Pain Management: Minimal or no pain anticipated   Induction: Intravenous  PONV Risk Score and Plan: 3 and Ondansetron, Dexamethasone and Treatment may vary due to age or medical condition  Airway Management Planned: Oral ETT  Additional Equipment:   Intra-op Plan:   Post-operative Plan: Extubation in OR  Informed Consent: I have reviewed the patients History and Physical, chart, labs and discussed the procedure including the risks, benefits and alternatives for the proposed anesthesia with the patient or authorized representative who has indicated his/her understanding and acceptance.     Dental advisory given  Plan Discussed with: CRNA and Surgeon  Anesthesia Plan Comments:        Anesthesia Quick Evaluation

## 2022-12-17 NOTE — Addendum Note (Signed)
Addendum  created 12/17/22 1632 by Mal Amabile, MD   Attestation recorded in Intraprocedure, Intraprocedure Attestations deleted, Intraprocedure Attestations filed, Intraprocedure Event edited, Optician, dispensing edited

## 2022-12-18 ENCOUNTER — Telehealth: Payer: Self-pay | Admitting: *Deleted

## 2022-12-18 ENCOUNTER — Other Ambulatory Visit: Payer: Self-pay

## 2022-12-18 ENCOUNTER — Encounter (HOSPITAL_COMMUNITY): Payer: Self-pay | Admitting: Gynecologic Oncology

## 2022-12-18 ENCOUNTER — Other Ambulatory Visit: Payer: Self-pay | Admitting: Gynecologic Oncology

## 2022-12-18 DIAGNOSIS — C541 Malignant neoplasm of endometrium: Secondary | ICD-10-CM

## 2022-12-18 NOTE — Progress Notes (Signed)
Could one of you schedule her PET scan (order in)? When you call her for post-op check in, please let her know I spoke with the radiologist and they think lymph node is big enough that it is worth getting the PET to see if the lymph node lights up (we discussed this yesterday prior to the procedure). Thank you

## 2022-12-18 NOTE — Telephone Encounter (Signed)
Spoke with Amanda Spence this morning. She states she is eating, drinking and urinating well. She has not had a BM yet but is passing gas.  Encouraged her to drink plenty of water. She denies fever or chills. She rates her pain 1/10. Her pain is controlled with tylenol.    Relayed message from Dr.Tucker that provider spoke with radiologist and they think lymph node is big enough that it is worth getting the PET scan to see if the lymph node lights up. Pt verbalized understanding and PET scan scheduled at [facility] long on 12/19 at 1:30 with arrival time of 1:00 pm. Pt instructed to have no food or drink 6 hours prior to appt.   Instructed to call office with any fever, chills, purulent drainage, uncontrolled pain or any other questions or concerns. Patient verbalizes understanding.   Pt aware of post op appointments as well as the office number [phone] and after hours number [phone] to call if she has any questions or concerns

## 2022-12-18 NOTE — Telephone Encounter (Signed)
-----   Message from Carver Fila sent at 12/18/2022  7:51 AM EST ----- Could one of you schedule her PET scan (order in)? When you call her for post-op check in, please let her know I spoke with the radiologist and they think lymph node is big enough that it is worth getting the PET to see if the lymph node lights up (w e discussed this yesterday prior to the procedure). Thank you

## 2022-12-18 NOTE — Progress Notes (Signed)
Called radiologist to discuss utility of getting PET scan to evaluate pelvic LN. Given size, recommendation was to proceed with PET as it is likely just at the threshold of PET evaluation. Order placed. Discussed this possibility (getting a PET) versus close follow-up with an MRI in 3 months with the patient prior to surgery.  Eugene Garnet MD Gynecologic Oncology

## 2022-12-22 LAB — SURGICAL PATHOLOGY

## 2022-12-24 ENCOUNTER — Inpatient Hospital Stay: Payer: Commercial Managed Care - PPO | Attending: Gynecologic Oncology | Admitting: Gynecologic Oncology

## 2022-12-24 ENCOUNTER — Encounter: Payer: Self-pay | Admitting: Gynecologic Oncology

## 2022-12-24 DIAGNOSIS — C541 Malignant neoplasm of endometrium: Secondary | ICD-10-CM

## 2022-12-24 NOTE — Progress Notes (Unsigned)
Called the patient just before scheduled phone visit. No answer. Left voicemail. Since she is scheduled for PET scan next week, I will call her then with results and to discuss any change in treatment plan. Asked her to call the office tomorrow if any questions or concerns at this time.  Eugene Garnet MD Gynecologic Oncology

## 2023-01-01 ENCOUNTER — Ambulatory Visit (HOSPITAL_COMMUNITY)
Admission: RE | Admit: 2023-01-01 | Discharge: 2023-01-01 | Disposition: A | Discharge status: Home / Self Care | Payer: Commercial Managed Care - PPO | Source: Ambulatory Visit | Attending: Gynecologic Oncology | Admitting: Gynecologic Oncology

## 2023-01-01 DIAGNOSIS — C541 Malignant neoplasm of endometrium: Secondary | ICD-10-CM | POA: Insufficient documentation

## 2023-01-01 LAB — GLUCOSE, CAPILLARY: Glucose-Capillary: 101 mg/dL — ABNORMAL HIGH (ref 70–99)

## 2023-01-01 MED ORDER — FLUDEOXYGLUCOSE F - 18 (FDG) INJECTION
12.9000 | Freq: Once | INTRAVENOUS | Status: AC | PRN
  Administered 2023-01-01: 12.9 via INTRAVENOUS

## 2023-01-05 ENCOUNTER — Encounter: Payer: Self-pay | Admitting: Gynecologic Oncology

## 2023-01-05 ENCOUNTER — Other Ambulatory Visit: Payer: Self-pay | Admitting: Oncology

## 2023-01-05 LAB — MOLECULAR PATHOLOGY

## 2023-01-05 NOTE — Progress Notes (Signed)
Gynecologic Oncology Multi-Disciplinary Disposition Conference Note  Date of the Conference: 01/05/2023  Patient Name: Amanda Spence  Referring Provider: Dr. Connye Burkitt Primary GYN Oncologist: Dr. 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 Disposition is to resample in 3 months. Consideration for definitive surgery if diabetes is under control.   This Multidisciplinary conference took place involving physicians from Gynecologic Oncology, Medical Oncology, Radiation Oncology, Pathology, Radiology along with the Gynecologic Oncology Nurse Practitioner and Gynecologic Oncology Nurse Navigator.  Comprehensive assessment of the patient's malignancy, staging, need for surgery, chemotherapy, radiation therapy, and need for further testing were reviewed. Supportive measures, both inpatient and following discharge were also discussed. The recommended plan of care is documented. Greater than 35 minutes were spent correlating and coordinating this patient's care.

## 2023-01-16 ENCOUNTER — Telehealth: Payer: Self-pay | Admitting: *Deleted

## 2023-01-16 NOTE — Telephone Encounter (Signed)
 Spoke with Amanda Spence and relayed message from Amanda Epps, NP that Dr. Viktoria can do her surgery on Feb 19 or the 20th.?  Pt agreed to surgery on Feb. 19, and advised the office would call her back with a date for pre-op appt. With Dr. Viktoria and Amanda Epps, NP.

## 2023-01-16 NOTE — Telephone Encounter (Signed)
-----   Message from Eleanor JONETTA Epps sent at 01/16/2023  2:33 PM EST ----- Can someone please reach out to the patient and see if she would like to have surgery with Dr. Viktoria on Feb 19 or 20? She would need an appt in the office with Dr. Viktoria and myself before.

## 2023-01-20 ENCOUNTER — Telehealth: Payer: Self-pay | Admitting: *Deleted

## 2023-01-20 NOTE — Telephone Encounter (Signed)
 Patient scheduled for appts with Dr Pricilla Holm and Warner Mccreedy NP  on 1/10 starting at 2:15 pm

## 2023-01-23 ENCOUNTER — Inpatient Hospital Stay (HOSPITAL_BASED_OUTPATIENT_CLINIC_OR_DEPARTMENT_OTHER): Payer: Commercial Managed Care - HMO | Admitting: Gynecologic Oncology

## 2023-01-23 ENCOUNTER — Encounter: Payer: Self-pay | Admitting: Gynecologic Oncology

## 2023-01-23 ENCOUNTER — Inpatient Hospital Stay: Payer: Commercial Managed Care - HMO | Attending: Gynecologic Oncology | Admitting: Gynecologic Oncology

## 2023-01-23 VITALS — BP 109/76 | HR 93 | Temp 98.8°F | Resp 20 | Wt 264.2 lb

## 2023-01-23 DIAGNOSIS — Z79890 Hormone replacement therapy (postmenopausal): Secondary | ICD-10-CM | POA: Insufficient documentation

## 2023-01-23 DIAGNOSIS — C541 Malignant neoplasm of endometrium: Secondary | ICD-10-CM

## 2023-01-23 DIAGNOSIS — E1165 Type 2 diabetes mellitus with hyperglycemia: Secondary | ICD-10-CM

## 2023-01-23 DIAGNOSIS — E119 Type 2 diabetes mellitus without complications: Secondary | ICD-10-CM | POA: Insufficient documentation

## 2023-01-23 DIAGNOSIS — N2889 Other specified disorders of kidney and ureter: Secondary | ICD-10-CM

## 2023-01-23 MED ORDER — OXYCODONE HCL 5 MG PO TABS: 5.0000 mg | ORAL_TABLET | ORAL | 0 refills | Status: DC | PRN

## 2023-01-23 MED ORDER — SENNOSIDES-DOCUSATE SODIUM 8.6-50 MG PO TABS: 2.0000 | ORAL_TABLET | Freq: Every day | ORAL | 0 refills | Status: DC

## 2023-01-23 MED ORDER — TRAMADOL HCL 50 MG PO TABS: 50.0000 mg | ORAL_TABLET | Freq: Four times a day (QID) | ORAL | 0 refills | Status: DC | PRN

## 2023-01-23 NOTE — Patient Instructions (Addendum)
 Preparing for your Surgery  Plan for surgery on March 04, 2023 with Dr. Comer Dollar at [facility]. You will be scheduled for robotic assisted total laparoscopic hysterectomy (removal of the uterus and cervix), bilateral salpingo-oophorectomy (removal of both ovaries and fallopian tubes), sentinel lymph node biopsy, possible lymph node dissection, possible laparotomy (larger incision on your abdomen if needed).    Pre-operative Testing -You will receive a phone call from presurgical testing at [facility] to arrange for a pre-operative appointment and lab work.  -Bring your insurance card, copy of an advanced directive if applicable, medication list  -At that visit, you will be asked to sign a consent for a possible blood transfusion in case a transfusion becomes necessary during surgery.  The need for a blood transfusion is rare but having consent is a necessary part of your care.     -You should not be taking blood thinners or aspirin at least ten days prior to surgery unless instructed by your surgeon.  -Do not take supplements such as fish oil (omega 3), red yeast rice, turmeric before your surgery. STOP TAKING AT LEAST 10 DAYS BEFORE SURGERY. You want to avoid medications with aspirin in them including headache powders such as BC or Goody's), Excedrin migraine.  Day Before Surgery at Home -You will be asked to take in a light diet the day before surgery. You will be advised you can have clear liquids up until 3 hours before your surgery.    Eat a light diet the day before surgery.  Examples including soups, broths, toast, yogurt, mashed potatoes.  AVOID GAS PRODUCING FOODS AND BEVERAGES. Things to avoid include carbonated beverages (fizzy beverages, sodas), raw fruits and raw vegetables (uncooked), or beans.   If your bowels are filled with gas, your surgeon will have difficulty visualizing your pelvic organs which increases your surgical risks.  Your role in  recovery Your role is to become active as soon as directed by your doctor, while still giving yourself time to heal.  Rest when you feel tired. You will be asked to do the following in order to speed your recovery:  - Cough and breathe deeply. This helps to clear and expand your lungs and can prevent pneumonia after surgery.  - STAY ACTIVE WHEN YOU GET HOME. Do mild physical activity. Walking or moving your legs help your circulation and body functions return to normal. Do not try to get up or walk alone the first time after surgery.   -If you develop swelling on one leg or the other, pain in the back of your leg, redness/warmth in one of your legs, please call the office or go to the Emergency Room to have a doppler to rule out a blood clot. For shortness of breath, chest pain-seek care in the Emergency Room as soon as possible. - Actively manage your pain. Managing your pain lets you move in comfort. We will ask you to rate your pain on a scale of zero to 10. It is your responsibility to tell your doctor or nurse where and how much you hurt so your pain can be treated.  Special Considerations -If you are diabetic, you may be placed on insulin  after surgery to have closer control over your blood sugars to promote healing and recovery.  This does not mean that you will be discharged on insulin .  If applicable, your oral antidiabetics will be resumed when you are tolerating a solid diet.  -Your final pathology results from surgery should  be available around one week after surgery and the results will be relayed to you when available.  -FMLA forms can be faxed to [phone] and please allow 5-7 business days for completion.  Pain Management After Surgery -You will be prescribed your pain medication and bowel regimen medications before surgery so that you can have these available when you are discharged from the hospital. The pain medication is for use ONLY AFTER surgery and a new prescription will  not be given.   -Make sure that you have Tylenol  and Ibuprofen  IF YOU ARE ABLE TO TAKE THESE MEDICATIONS at home to use on a regular basis after surgery for pain control. We recommend alternating the medications every hour to six hours since they work differently and are processed in the body differently for pain relief.  -Review the attached handout on narcotic use and their risks and side effects.   Bowel Regimen -You will be prescribed Sennakot-S to take nightly to prevent constipation especially if you are taking the narcotic pain medication intermittently.  It is important to prevent constipation and drink adequate amounts of liquids. You can stop taking this medication when you are not taking pain medication and you are back on your normal bowel routine.  Risks of Surgery Risks of surgery are low but include bleeding, infection, damage to surrounding structures, re-operation, blood clots, and very rarely death.   Blood Transfusion Information (For the consent to be signed before surgery)  We will be checking your blood type before surgery so in case of emergencies, we will know what type of blood you would need.                                            WHAT IS A BLOOD TRANSFUSION?  A transfusion is the replacement of blood or some of its parts. Blood is made up of multiple cells which provide different functions. Red blood cells carry oxygen and are used for blood loss replacement. White blood cells fight against infection. Platelets control bleeding. Plasma helps clot blood. Other blood products are available for specialized needs, such as hemophilia or other clotting disorders. BEFORE THE TRANSFUSION  Who gives blood for transfusions?  You may be able to donate blood to be used at a later date on yourself (autologous donation). Relatives can be asked to donate blood. This is generally not any safer than if you have received blood from a stranger. The same precautions are taken  to ensure safety when a relative's blood is donated. Healthy volunteers who are fully evaluated to make sure their blood is safe. This is blood bank blood. Transfusion therapy is the safest it has ever been in the practice of medicine. Before blood is taken from a donor, a complete history is taken to make sure that person has no history of diseases nor engages in risky social behavior (examples are intravenous drug use or sexual activity with multiple partners). The donor's travel history is screened to minimize risk of transmitting infections, such as malaria. The donated blood is tested for signs of infectious diseases, such as HIV and hepatitis. The blood is then tested to be sure it is compatible with you in order to minimize the chance of a transfusion reaction. If you or a relative donates blood, this is often done in anticipation of surgery and is not appropriate for emergency situations. It takes many  days to process the donated blood. RISKS AND COMPLICATIONS Although transfusion therapy is very safe and saves many lives, the main dangers of transfusion include:  Getting an infectious disease. Developing a transfusion reaction. This is an allergic reaction to something in the blood you were given. Every precaution is taken to prevent this. The decision to have a blood transfusion has been considered carefully by your caregiver before blood is given. Blood is not given unless the benefits outweigh the risks.  AFTER SURGERY INSTRUCTIONS  Return to work: 4-6 weeks if applicable  Activity: 1. Be up and out of the bed during the day.  Take a nap if needed.  You may walk up steps but be careful and use the hand rail.  Stair climbing will tire you more than you think, you may need to stop part way and rest.   2. No lifting or straining for 6 weeks over 10 pounds. No pushing, pulling, straining for 6 weeks.  3. No driving for 4-89 days when the following criteria have been met: Do not drive if you  are taking narcotic pain medicine and make sure that your reaction time has returned.   4. You can shower as soon as the next day after surgery. Shower daily.  Use your regular soap and water  (not directly on the incision) and pat your incision(s) dry afterwards; don't rub.  No tub baths or submerging your body in water  until cleared by your surgeon. If you have the soap that was given to you by pre-surgical testing that was used before surgery, you do not need to use it afterwards because this can irritate your incisions.   5. No sexual activity and nothing in the vagina for 10-12 weeks.  6. You may experience a small amount of clear drainage from your incisions, which is normal.  If the drainage persists, increases, or changes color please call the office.  7. Do not use creams, lotions, or ointments such as neosporin on your incisions after surgery until advised by your surgeon because they can cause removal of the dermabond glue on your incisions.    8. You may experience vaginal spotting after surgery or when the stitches at the top of the vagina begin to dissolve.  The spotting is normal but if you experience heavy bleeding, call our office.  9. Take Tylenol  or ibuprofen  first for pain if you are able to take these medications and only use narcotic pain medication for severe pain not relieved by the Tylenol  or Ibuprofen .  Monitor your Tylenol  intake to a max of 4,000 mg in a 24 hour period. You can alternate these medications after surgery.  Diet: 1. Low sodium Heart Healthy Diet is recommended but you are cleared to resume your normal (before surgery) diet after your procedure.  2. It is safe to use a laxative, such as Miralax or Colace, if you have difficulty moving your bowels before surgery. You have been prescribed Sennakot-S to take at bedtime every evening after surgery to keep bowel movements regular and to prevent constipation.    Wound Care: 1. Keep clean and dry.  Shower  daily.  Reasons to call the Doctor: Fever - Oral temperature greater than 100.4 degrees Fahrenheit Foul-smelling vaginal discharge Difficulty urinating Nausea and vomiting Increased pain at the site of the incision that is unrelieved with pain medicine. Difficulty breathing with or without chest pain New calf pain especially if only on one side Sudden, continuing increased vaginal bleeding with or without clots.  Contacts: For questions or concerns you should contact:  Dr. Comer Dollar at [phone]  Eleanor Epps, NP at [phone]  After Hours: call [phone] and have the GYN Oncologist paged/contacted (after 5 pm or on the weekends). You will speak with an after hours RN and let he or she know you have had surgery.  Messages sent via mychart are for non-urgent matters and are not responded to after hours so for urgent needs, please call the after hours number.

## 2023-01-23 NOTE — Addendum Note (Signed)
 Addended by: Warner Mccreedy D on: 01/23/2023 03:16 PM   Modules accepted: Orders

## 2023-01-23 NOTE — Progress Notes (Signed)
 Gynecologic Oncology Return Clinic Visit  01/23/23  Reason for Visit: treatment planning  Treatment History: The patient reports having normal menses until a year and a half ago.  At that time, she began skipping up to several months between menses.  She denies any intermenstrual bleeding.  More recently, in March of this year, she began having spotting most days.  She describes this as blood when she wipes not requiring the use of a pad.  She continued to have more menstrual-like bleeding every 2-3 months.  She had a menstrual cycle in June, again after her Pap test in September, and again after her endometrial biopsy.   Pap test on 10/10/2022 revealed atypical glandular cells of undetermined significance.  High risk HPV negative.   Endometrial biopsy performed on 10/29 revealed well-differentiated endometrial adenocarcinoma.  Cervical biopsy and endocervical curettings showed benign ecto and endocervical tissue.  CT of abdomen on 12/04/2022 reveals no evidence of metastatic disease within the abdomen.  2.1 cm exophytic hypodense lesion within the left lower pole of the kidney, indeterminant.  Mild hepatomegaly and mild hepatic steatosis.  Bilateral nonobstructing nephrolithiasis.  MRI of the pelvis on 12/09/2022 reveals hypoenhancing masslike appearance of the endometrium extending into the endocervical canal, measuring up to 6.2 cm.  No evidence of myometrial invasion.  Prominent subcentimeter left pelvic sidewall lymph node, modestly suspicious for early nodal metastatic disease.  Trace free fluid.  Sigmoid diverticulosis.  In the setting of a hemoglobin A1c of 12% on 12/09/22, decision made to proceed with D&C with Mirena  IUD insertion while we work on glycemic control.  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  PET scan was performed on 01/01/2023 showing intense hypermetabolism throughout the endometrium.  No hypermetabolic metastatic disease.  Again indeterminate 1.9 cm left upper renal cortex lesion noted, increased from CT scan in 2017.  Interval History: Doing well, came down with a cold shortly after the holiday.  With strong cough, started having some vaginal bleeding, this has decreased recently as her cough is improved.  Blood sugars have been running 100-1 50.  Her meter estimates what her hemoglobin A1c has been.  Over the last 30 days, estimate is 6.5%.  Past Medical/Surgical History: Past Medical History:  Diagnosis Date   Anxiety    Arthritis    osteoarthritis   Asthma    Depression    Situational   Eczema    Endometrial cancer (HCC)    History of kidney stones    Hx of migraine headaches    Hypertension    Hypothyroidism    OSA (obstructive sleep apnea)    Type 2 diabetes mellitus (HCC)    ozempic, metformin     Past Surgical History:  Procedure Laterality Date   DILATATION & CURETTAGE/HYSTEROSCOPY WITH MYOSURE N/A 12/17/2022   Procedure: DILATATION & CURETTAGE/HYSTEROSCOPY WITH MYOSURE;  Surgeon: Viktoria Comer SAUNDERS, MD;  Location: [facility];  Service: Gynecology;  Laterality: N/A;   EXTRACORPOREAL SHOCK WAVE LITHOTRIPSY Left 01/17/2016   Procedure: EXTRACORPOREAL SHOCK WAVE LITHOTRIPSY (ESWL);  Surgeon: Glendia Elizabeth, MD;  Location: [facility];  Service: Urology;  Laterality: Left;   FOOT SURGERY     HAND SURGERY     INTRAUTERINE DEVICE (IUD) INSERTION N/A 12/17/2022   Procedure: INTRAUTERINE DEVICE (IUD) INSERTION MIRENA , TILT TEST;  Surgeon: Viktoria Comer SAUNDERS, MD;  Location: [facility];  Service: Gynecology;  Laterality: N/A;   LASIK Bilateral 2002   LITHOTRIPSY  2001   URETEROSCOPY WITH HOLMIUM LASER LITHOTRIPSY Right 2008 x2   WISDOM TOOTH EXTRACTION Bilateral 2002    Family History  Problem Relation Age of Onset   Lung cancer Father    COPD Sister     Endometrial cancer Sister    Lung cancer Paternal Aunt    Lung cancer Paternal Grandmother    Breast cancer Neg Hx    Ovarian cancer Neg Hx    Pancreatic cancer Neg Hx    Colon cancer Neg Hx    Prostate cancer Neg Hx     Social History   Socioeconomic History   Marital status: Single    Spouse name: Not on file   Number of children: Not on file   Years of education: Not on file   Highest education level: Not on file  Occupational History   Not on file  Tobacco Use   Smoking status: Never    Passive exposure: Never   Smokeless tobacco: Never  Vaping Use   Vaping status: Never Used  Substance and Sexual Activity   Alcohol use: Yes    Comment: 2-3 drinks per week   Drug use: No   Sexual activity: Not Currently  Other Topics Concern   Not on file  Social History Narrative   Not on file   Social Drivers of Health   Financial Resource Strain: Not on file  Food Insecurity: No Food Insecurity (11/26/2022)   Hunger Vital Sign    Worried About Running Out of Food in the Last Year: Never true    Ran Out of Food in the Last Year: Never true  Transportation Needs: Unknown (11/26/2022)   PRAPARE - Administrator, Civil Service (Medical): Not on file    Lack of Transportation (Non-Medical): No  Physical Activity: Not on file  Stress: Not on file  Social Connections: Not on file    Current Medications:  Current Outpatient Medications:    senna-docusate (SENOKOT-S) 8.6-50 MG tablet, Take 2 tablets by mouth at bedtime. For AFTER surgery, do not take if having diarrhea, Disp: 30 tablet, Rfl: 0   traMADol  (ULTRAM ) 50 MG tablet, Take 1 tablet (50 mg total) by mouth every 6 (six) hours as needed for severe pain (pain score 7-10). For AFTER surgery only, do not take and drive, Disp: 10 tablet, Rfl: 0   albuterol  (PROVENTIL  HFA;VENTOLIN  HFA) 108 (90 Base) MCG/ACT inhaler, Inhale 2 puffs into the lungs every 4 (four) hours as needed for wheezing or shortness of breath.,  Disp: , Rfl:    Cholecalciferol 50 MCG (2000 UT) CAPS, Take 2,000 Units by mouth daily., Disp: , Rfl:    Cinnamon 500 MG capsule, Take by mouth., Disp: , Rfl:    EPINEPHrine  0.3 mg/0.3 mL IJ SOAJ injection, Inject 0.3 mg into the muscle as needed for anaphylaxis., Disp: , Rfl:    escitalopram  (LEXAPRO ) 20 MG tablet, Take 20 mg by mouth daily., Disp: , Rfl:    fexofenadine-pseudoephedrine (ALLEGRA-D ALLERGY  & CONGESTION) 180-240 MG 24 hr tablet, Take 1 tablet by mouth daily., Disp: , Rfl:    fluticasone (FLONASE ALLERGY  RELIEF) 50 MCG/ACT nasal spray, Place 2 sprays into both nostrils daily., Disp: , Rfl:    fluticasone-salmeterol (WIXELA INHUB) 100-50 MCG/ACT AEPB, Inhale 1 puff into the lungs 2 (two) times daily., Disp: , Rfl:    levothyroxine  (SYNTHROID , LEVOTHROID) 150 MCG tablet, Take 200 mcg by mouth daily before breakfast.,  Disp: , Rfl:    lisinopril  (ZESTRIL ) 10 MG tablet, Take 10 mg by mouth daily., Disp: , Rfl:    metFORMIN  (GLUCOPHAGE -XR) 500 MG 24 hr tablet, Take 1,000 mg by mouth 2 (two) times daily., Disp: , Rfl:    ondansetron  (ZOFRAN ) 8 MG tablet, Take 10 mg by mouth every 8 (eight) hours as needed for nausea or vomiting., Disp: , Rfl:    rizatriptan (MAXALT) 10 MG tablet, Take 10 mg by mouth as needed for migraine. May repeat in 2 hours if needed, Disp: , Rfl:    rosuvastatin  (CRESTOR ) 10 MG tablet, Take 10 mg by mouth daily., Disp: , Rfl:    TRESIBA  100 UNIT/ML SOLN, Inject 26 Units into the skin daily., Disp: , Rfl:    zolpidem (AMBIEN) 10 MG tablet, Take 10 mg by mouth at bedtime as needed for sleep., Disp: , Rfl:   Review of Systems: Denies appetite changes, fevers, chills, fatigue, unexplained weight changes. Denies hearing loss, neck lumps or masses, mouth sores, ringing in ears or voice changes. Denies wheezing.  Denies shortness of breath. Denies chest pain or palpitations. Denies leg swelling. Denies abdominal distention, pain, blood in stools, constipation, diarrhea,  nausea, vomiting, or early satiety. Denies pain with intercourse, dysuria, frequency, hematuria or incontinence. Denies hot flashes, pelvic pain,or vaginal discharge.   Denies joint pain, back pain or muscle pain/cramps. Denies itching, rash, or wounds. Denies dizziness, headaches, numbness or seizures. Denies swollen lymph nodes or glands, denies easy bruising or bleeding. Denies anxiety, depression, confusion, or decreased concentration.  Physical Exam: BP 109/76 (BP Location: Left Arm, Patient Position: Sitting)   Pulse 93   Temp 98.8 F (37.1 C) (Oral)   Resp 20   Wt 264 lb 3.2 oz (119.8 kg)   SpO2 99%   BMI 48.32 kg/m  General: Alert, oriented, no acute distress. HEENT: Posterior oropharynx clear, sclera anicteric. Chest: Unlabored breathing on room air.  Laboratory & Radiologic Studies: PET 12/20: 1. Intense hypermetabolism throughout the endometrium, compatible with known primary endometrial malignancy. 2. No hypermetabolic metastatic disease. 3. Indeterminate exophytic isodense 1.9 cm lateral upper left renal cortical lesion, increased from 1.1 cm on 01/04/2016 CT, cannot exclude renal neoplasm. Recommend further evaluation with abdominal MRI without and with IV contrast. 4. Chronic findings include: Diffuse hepatic steatosis. Nonobstructing bilateral renal stones. Small to moderate fat containing supraumbilical ventral abdominal hernia near the midline. Aortic Atherosclerosis (ICD10-I70.0).  Assessment & Plan: Amanda Spence is a 53 y.o. 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 MMRd. MHL1 hypermethylation present. Hormonal treatment initiated in December secondary to hyperglycemia. Imaging shows no evidence of myometrial invasion, no definitive evidence of metastatic disease.  Patient is overall doing well.  She has had significant improvement in her glycemic control since our first  visit.  Given improved glycemic control, we are planning to proceed with definitive surgery in February.  The patient is a suitable candidate for staging via a minimally invasive approach to surgery.  We reviewed that robotic assistance would be used to complete the surgery.   We reviewed the sentinel lymph node technique. Risks and benefits of sentinel lymph node biopsy was reviewed. We reviewed the technique and ICG dye. The patient DOES NOT have an iodine allergy  or known liver dysfunction. We reviewed the false negative rate (0.4%), and that 3% of patients with metastatic disease will not have it detected by SLN biopsy in endometrial cancer. A low risk of allergic  reaction to the dye, <0.2% for ICG, has been reported. We also discussed that in the case of failed mapping, which occurs 40% of the time, a bilateral or unilateral lymphadenectomy will be performed at the surgeon's discretion.   Potential benefits of sentinel nodes including a higher detection rate for metastasis due to ultrastaging and potential reduction in operative morbidity. However, there remains uncertainty as to the role for treatment of micrometastatic disease. Further, the benefit of operative morbidity associated with the SLN technique in endometrial cancer is not yet completely known. In other patient populations (e.g. the cervical cancer population) there has been observed reductions in morbidity with SLN biopsy compared to pelvic lymphadenectomy. Lymphedema, nerve dysfunction and lymphocysts are all potential risks with the SLN technique as with complete lymphadenectomy. Additional risks to the patient include the risk of damage to an internal organ while operating in an altered view (e.g. the black and white image of the robotic fluorescence imaging mode).   We discussed plan for a robotic assisted hysterectomy, bilateral salpingo-oophorectomy, sentinel lymph node evaluation, possible lymph node dissection, possible laparotomy.  The risks of surgery were discussed in detail and she understands these to include infection; wound separation; hernia; vaginal cuff separation, injury to adjacent organs such as bowel, bladder, blood vessels, ureters and nerves; bleeding which may require blood transfusion; anesthesia risk; thromboembolic events; possible death; unforeseen complications; possible need for re-exploration; medical complications such as heart attack, stroke, pleural effusion and pneumonia; and, if full lymphadenectomy is performed the risk of lymphedema and lymphocyst. The patient will receive DVT and antibiotic prophylaxis as indicated. She voiced a clear understanding. She had the opportunity to ask questions. Perioperative instructions were reviewed with her. Prescriptions for post-op medications were sent to her pharmacy of choice.  Given renal lesion seen on her PET scan, MRI of the abdomen ordered per radiology recommendations.  This was scheduled for later this month.  28 minutes of total time was spent for this patient encounter, including preparation, face-to-face counseling with the patient and coordination of care, and documentation of the encounter.  Comer Dollar, MD  Division of Gynecologic Oncology  Department of Obstetrics and Gynecology  [facility]

## 2023-01-26 NOTE — Progress Notes (Signed)
 Patient here for follow up with Dr. Viktoria and for a pre-operative appointment prior to her scheduled surgery on 03/04/2023. She is scheduled for robotic assisted total laparoscopic hysterectomy, bilateral salpingo-oophorectomy, sentinel lymph node biopsy, possible lymph node dissection, possible laparotomy. The surgery was discussed in detail.  See after visit summary for additional details.    Discussed post-op pain management in detail including the aspects of the enhanced recovery pathway.  Advised her that a new prescription would be sent in for oxycodone  and it is only to be used for after her upcoming surgery.  We discussed the use of tylenol  post-op and to monitor for a maximum of 4,000 mg in a 24 hour period.  Also prescribed sennakot to be used after surgery and to hold if having loose stools.  Discussed bowel regimen in detail.     Discussed the use of SCDs and measures to take at home to prevent DVT including frequent mobility.  Reportable signs and symptoms of DVT discussed. Post-operative instructions discussed and expectations for after surgery. Incisional care discussed as well including reportable signs and symptoms including erythema, drainage, wound separation.     10 minutes spent preparing information and with the patient.  Verbalizing understanding of material discussed. No needs or concerns voiced at the end of the visit.   Advised patient to call for any needs.  Advised that her post-operative medications had been prescribed and could be picked up at any time.    This appointment is included in the global surgical bundle as pre-operative teaching and has no charge.

## 2023-02-02 ENCOUNTER — Ambulatory Visit (HOSPITAL_COMMUNITY)
Admission: RE | Admit: 2023-02-02 | Discharge: 2023-02-02 | Disposition: A | Discharge status: Home / Self Care | Payer: Commercial Managed Care - HMO | Source: Ambulatory Visit | Attending: Gynecologic Oncology | Admitting: Gynecologic Oncology

## 2023-02-02 DIAGNOSIS — N2889 Other specified disorders of kidney and ureter: Secondary | ICD-10-CM | POA: Insufficient documentation

## 2023-02-02 MED ORDER — GADOBUTROL 1 MMOL/ML IV SOLN
10.0000 mL | Freq: Once | INTRAVENOUS | Status: AC | PRN
  Administered 2023-02-02: 10 mL via INTRAVENOUS

## 2023-02-06 ENCOUNTER — Encounter: Payer: Self-pay | Admitting: Gynecologic Oncology

## 2023-02-13 ENCOUNTER — Telehealth: Payer: Self-pay | Admitting: *Deleted

## 2023-02-13 NOTE — Telephone Encounter (Signed)
Per Dr Pricilla Holm fax records and surgical optimization form to the patient's PCP office [phone])

## 2023-02-17 NOTE — Telephone Encounter (Signed)
 Received clearance

## 2023-02-18 NOTE — Patient Instructions (Addendum)
SURGICAL WAITING ROOM VISITATION  Patients having surgery or a procedure may have no more than 2 support people in the waiting area - these visitors may rotate.    Children under the age of 19 must have an adult with them who is not the patient.  Due to an increase in RSV and influenza rates and associated hospitalizations, children ages 56 and under may not visit patients in [facility] hospitals.  Visitors with respiratory illnesses are discouraged from visiting and should remain at home.  If the patient needs to stay at the hospital during part of their recovery, the visitor guidelines for inpatient rooms apply. Pre-op nurse will coordinate an appropriate time for 1 support person to accompany patient in pre-op.  This support person may not rotate.    Please refer to the [facility] website for the visitor guidelines for Inpatients (after your surgery is over and you are in a regular room).       Your procedure is scheduled on: 03-04-23   Report to [facility] Main Entrance    Report to admitting at      0615   AM   Call this number if you have problems the morning of surgery [phone]   Eat a light diet the day before surgery.  Examples including soups, broths, toast, yogurt, mashed potatoes.  Things to avoid include carbonated beverages (fizzy beverages), raw fruits and raw vegetables, or beans.   If your bowels are filled with gas, your surgeon will have difficulty visualizing your pelvic organs which increases your surgical risks.    Do not eat food :After Midnight.   After Midnight you may have the following liquids until _0530_____ AM/ DAY OF SURGERY  THEN NOTHING BY MOUTH  Water Non-Citrus Juices (without pulp, NO RED-Apple, White grape, White cranberry) Black Coffee (NO MILK/CREAM OR CREAMERS, sugar ok)  Clear Tea (NO MILK/CREAM OR CREAMERS, sugar ok) regular and decaf                             Plain Jell-O (NO RED)                                            Fruit ices (not with fruit pulp, NO RED)                                     Popsicles (NO RED)                                                               Sports drinks like Gatorade (NO RED)                       If you have questions, please contact your surgeon's office.   FOLLOW ANY ADDITIONAL PRE OP INSTRUCTIONS YOU RECEIVED FROM YOUR SURGEON'S OFFICE!!!     Oral Hygiene is also important to reduce your risk of infection.  Remember - BRUSH YOUR TEETH THE MORNING OF SURGERY WITH YOUR REGULAR TOOTHPASTE  DENTURES WILL BE REMOVED PRIOR TO SURGERY PLEASE DO NOT APPLY "Poly grip" OR ADHESIVES!!!   Do NOT smoke after Midnight   Stop all vitamins and herbal supplements 7 days before surgery.   Take these medicines the morning of surgery with A SIP OF WATER: rosuvastatin, levothyroxine, inhaler and bring rescue inhaler with you, Flonase, Buspar, allegra, lexapro  DO NOT TAKE ANY ORAL DIABETIC MEDICATIONS DAY OF YOUR SURGERY DO NOT TAKE TRESBIA DAY OF SURGERY  How to Manage Your Diabetes Before and After Surgery  Why is it important to control my blood sugar before and after surgery? Improving blood sugar levels before and after surgery helps healing and can limit problems. A way of improving blood sugar control is eating a healthy diet by:  Eating less sugar and carbohydrates  Increasing activity/exercise  Talking with your doctor about reaching your blood sugar goals High blood sugars (greater than 180 mg/dL) can raise your risk of infections and slow your recovery, so you will need to focus on controlling your diabetes during the weeks before surgery. Make sure that the doctor who takes care of your diabetes knows about your planned surgery including the date and location.  How do I manage my blood sugar before surgery? Check your blood sugar at least 4 times a day, starting 2 days before surgery, to make sure that the level is not too high  or low. Check your blood sugar the morning of your surgery when you wake up and every 2 hours until you get to the Short Stay unit. If your blood sugar is less than 70 mg/dL, you will need to treat for low blood sugar: Do not take insulin. Treat a low blood sugar (less than 70 mg/dL) with  cup of clear juice (cranberry or apple), 4 glucose tablets, OR glucose gel. Recheck blood sugar in 15 minutes after treatment (to make sure it is greater than 70 mg/dL). If your blood sugar is not greater than 70 mg/dL on recheck, call [phone] for further instructions. Report your blood sugar to the short stay nurse when you get to Short Stay.  If you are admitted to the hospital after surgery: Your blood sugar will be checked by the staff and you will probably be given insulin after surgery (instead of oral diabetes medicines) to make sure you have good blood sugar levels. The goal for blood sugar control after surgery is 80-180 mg/dL.   WHAT DO I DO ABOUT MY DIABETES MEDICATION?  Do not take oral diabetes medicines (pills) the morning of surgery  TRESBIA DAY BEFORE SURGERY TAKE AM DOSE AS NORMAL . DO NOT TAKE TRESBIA THE DAY OF SURGERY   Bring CPAP mask and tubing day of surgery.                              You may not have any metal on your body including hair pins, jewelry, and body piercing             Do not wear make-up, lotions, powders, perfumes/cologne, or deodorant  Do not wear nail polish including gel and S&S, artificial/acrylic nails, or any other type of covering on natural nails including finger and toenails. If you have artificial nails, gel coating, etc. that needs to be removed by a nail salon please have this removed prior to surgery or surgery may need to  be canceled/ delayed if the surgeon/ anesthesia feels like they are unable to be safely monitored.   Do not shave  48 hours prior to surgery.                  Do not bring valuables to the hospital. [facility] IS NOT              RESPONSIBLE   FOR VALUABLES.   Contacts, glasses, dentures or bridgework may not be worn into surgery.   Bring small overnight bag day of surgery.   DO NOT BRING YOUR HOME MEDICATIONS TO THE HOSPITAL. PHARMACY WILL DISPENSE MEDICATIONS LISTED ON YOUR MEDICATION LIST TO YOU DURING YOUR ADMISSION IN THE HOSPITAL!    Patients discharged on the day of surgery will not be allowed to drive home.  Someone NEEDS to stay with you for the first 24 hours after anesthesia.   Special Instructions: Bring a copy of your healthcare power of attorney and living will documents the day of surgery if you haven't scanned them before.              Please read over the following fact sheets you were given: IF YOU HAVE QUESTIONS ABOUT YOUR PRE-OP INSTRUCTIONS PLEASE CALL [phone]   I If you test positive for Covid or have been in contact with anyone that has tested positive in the last 10 days please notify you surgeon.    [facility] - Preparing for Surgery Before surgery, you can play an important role.  Because skin is not sterile, your skin needs to be as free of germs as possible.  You can reduce the number of germs on your skin by washing with CHG (chlorahexidine gluconate) soap before surgery.  CHG is an antiseptic cleaner which kills germs and bonds with the skin to continue killing germs even after washing. Please DO NOT use if you have an allergy to CHG or antibacterial soaps.  If your skin becomes reddened/irritated stop using the CHG and inform your nurse when you arrive at Short Stay. Do not shave (including legs and underarms) for at least 48 hours prior to the first CHG shower.  You may shave your face/neck. Please follow these instructions carefully:  1.  Shower with CHG Soap the night before surgery and the  morning of Surgery.  2.  If you choose to wash your hair, wash your hair first as usual with your  normal  shampoo.  3.  After you shampoo, rinse your hair and body thoroughly to  remove the  shampoo.                           4.  Use CHG as you would any other liquid soap.  You can apply chg directly  to the skin and wash                       Gently with a scrungie or clean washcloth.  5.  Apply the CHG Soap to your body ONLY FROM THE NECK DOWN.   Do not use on face/ open                           Wound or open sores. Avoid contact with eyes, ears mouth and genitals (private parts).  Wash face,  Genitals (private parts) with your normal soap.             6.  Wash thoroughly, paying special attention to the area where your surgery  will be performed.  7.  Thoroughly rinse your body with warm water from the neck down.  8.  DO NOT shower/wash with your normal soap after using and rinsing off  the CHG Soap.                9.  Pat yourself dry with a clean towel.            10.  Wear clean pajamas.            11.  Place clean sheets on your bed the night of your first shower and do not  sleep with pets. Day of Surgery : Do not apply any lotions/deodorants the morning of surgery.  Please wear clean clothes to the hospital/surgery center.  FAILURE TO FOLLOW THESE INSTRUCTIONS MAY RESULT IN THE CANCELLATION OF YOUR SURGERY PATIENT SIGNATURE_________________________________  NURSE SIGNATURE__________________________________  ________________________________________________________________________ WHAT IS A BLOOD TRANSFUSION? Blood Transfusion Information  A transfusion is the replacement of blood or some of its parts. Blood is made up of multiple cells which provide different functions. Red blood cells carry oxygen and are used for blood loss replacement. White blood cells fight against infection. Platelets control bleeding. Plasma helps clot blood. Other blood products are available for specialized needs, such as hemophilia or other clotting disorders. BEFORE THE TRANSFUSION  Who gives blood for transfusions?  Healthy volunteers who are fully  evaluated to make sure their blood is safe. This is blood bank blood. Transfusion therapy is the safest it has ever been in the practice of medicine. Before blood is taken from a donor, a complete history is taken to make sure that person has no history of diseases nor engages in risky social behavior (examples are intravenous drug use or sexual activity with multiple partners). The donor's travel history is screened to minimize risk of transmitting infections, such as malaria. The donated blood is tested for signs of infectious diseases, such as HIV and hepatitis. The blood is then tested to be sure it is compatible with you in order to minimize the chance of a transfusion reaction. If you or a relative donates blood, this is often done in anticipation of surgery and is not appropriate for emergency situations. It takes many days to process the donated blood. RISKS AND COMPLICATIONS Although transfusion therapy is very safe and saves many lives, the main dangers of transfusion include:  Getting an infectious disease. Developing a transfusion reaction. This is an allergic reaction to something in the blood you were given. Every precaution is taken to prevent this. The decision to have a blood transfusion has been considered carefully by your caregiver before blood is given. Blood is not given unless the benefits outweigh the risks. AFTER THE TRANSFUSION Right after receiving a blood transfusion, you will usually feel much better and more energetic. This is especially true if your red blood cells have gotten low (anemic). The transfusion raises the level of the red blood cells which carry oxygen, and this usually causes an energy increase. The nurse administering the transfusion will monitor you carefully for complications. HOME CARE INSTRUCTIONS  No special instructions are needed after a transfusion. You may find your energy is better. Speak with your caregiver about any limitations on activity for  underlying diseases you may have. SEEK MEDICAL  CARE IF:  Your condition is not improving after your transfusion. You develop redness or irritation at the intravenous (IV) site. SEEK IMMEDIATE MEDICAL CARE IF:  Any of the following symptoms occur over the next 12 hours: Shaking chills. You have a temperature by mouth above 102 F (38.9 C), not controlled by medicine. Chest, back, or muscle pain. People around you feel you are not acting correctly or are confused. Shortness of breath or difficulty breathing. Dizziness and fainting. You get a rash or develop hives. You have a decrease in urine output. Your urine turns a dark color or changes to pink, red, or brown. Any of the following symptoms occur over the next 10 days: You have a temperature by mouth above 102 F (38.9 C), not controlled by medicine. Shortness of breath. Weakness after normal activity. The white part of the eye turns yellow (jaundice). You have a decrease in the amount of urine or are urinating less often. Your urine turns a dark color or changes to pink, red, or brown. Document Released: 12/28/1999 Document Revised: 03/24/2011 Document Reviewed: 08/16/2007 [facility] Patient Information 2014 [place], [place].  _______________________________________________________________________

## 2023-02-18 NOTE — Progress Notes (Addendum)
PCP - Carilyn Goodpasture ,NP  [facility] Cardiologist - no  PPM/ICD -  Device Orders -  Rep Notified -   Chest x-ray -  EKG - 12-09-22 epic Stress Test -  ECHO -  Cardiac Cath -   Sleep Study -  CPAP - yes  Fasting Blood Sugar - 110-130 Checks Blood Sugar __Free style Libre___ times a day  Blood Thinner Instructions: Aspirin Instructions:no  ERAS Protcol - PRE-SURGERY no drink   COVID vaccine -yes  Activity--Able to climb a flight of stairs without CP or SOB  Anesthesia review: DM2, HTN, asthma, OSA  Patient denies shortness of breath, fever, cough and chest pain at PAT appointment   All instructions explained to the patient, with a verbal understanding of the material. Patient agrees to go over the instructions while at home for a better understanding. Patient also instructed to self quarantine after being tested for COVID-19. The opportunity to ask questions was provided.

## 2023-02-27 ENCOUNTER — Ambulatory Visit: Payer: Commercial Managed Care - PPO | Admitting: Gynecologic Oncology

## 2023-02-27 ENCOUNTER — Other Ambulatory Visit: Payer: Self-pay

## 2023-02-27 ENCOUNTER — Encounter (HOSPITAL_COMMUNITY)
Admission: RE | Admit: 2023-02-27 | Discharge: 2023-02-27 | Disposition: A | Discharge status: Home / Self Care | Payer: Commercial Managed Care - HMO | Source: Ambulatory Visit | Attending: Gynecologic Oncology | Admitting: Gynecologic Oncology

## 2023-02-27 ENCOUNTER — Encounter (HOSPITAL_COMMUNITY): Payer: Self-pay

## 2023-02-27 VITALS — BP 122/79 | HR 81 | Temp 98.2°F | Resp 16 | Ht 62.0 in | Wt 264.0 lb

## 2023-02-27 DIAGNOSIS — C541 Malignant neoplasm of endometrium: Secondary | ICD-10-CM | POA: Diagnosis not present

## 2023-02-27 DIAGNOSIS — Z01812 Encounter for preprocedural laboratory examination: Secondary | ICD-10-CM | POA: Insufficient documentation

## 2023-02-27 DIAGNOSIS — E119 Type 2 diabetes mellitus without complications: Secondary | ICD-10-CM | POA: Insufficient documentation

## 2023-02-27 DIAGNOSIS — Z01818 Encounter for other preprocedural examination: Secondary | ICD-10-CM | POA: Diagnosis present

## 2023-02-27 HISTORY — DX: Headache, unspecified: R51.9

## 2023-02-27 LAB — COMPREHENSIVE METABOLIC PANEL
ALT: 16 U/L (ref 0–44)
AST: 20 U/L (ref 15–41)
Albumin: 3.7 g/dL (ref 3.5–5.0)
Alkaline Phosphatase: 52 U/L (ref 38–126)
Anion gap: 9 (ref 5–15)
BUN: 26 mg/dL — ABNORMAL HIGH (ref 6–20)
CO2: 24 mmol/L (ref 22–32)
Calcium: 9.5 mg/dL (ref 8.9–10.3)
Chloride: 106 mmol/L (ref 98–111)
Creatinine, Ser: 0.72 mg/dL (ref 0.44–1.00)
GFR, Estimated: >60 mL/min (ref >60)
Glucose, Bld: 129 mg/dL — ABNORMAL HIGH (ref 70–99)
Potassium: 4.8 mmol/L (ref 3.5–5.1)
Sodium: 139 mmol/L (ref 135–145)
Total Bilirubin: 0.6 mg/dL (ref 0.0–1.2)
Total Protein: 7.5 g/dL (ref 6.5–8.1)

## 2023-02-27 LAB — HEMOGLOBIN A1C
Hgb A1c MFr Bld: 6.7 % — ABNORMAL HIGH (ref 4.8–5.6)
Mean Plasma Glucose: 145.59 mg/dL

## 2023-02-27 LAB — CBC
HCT: 40.1 % (ref 36.0–46.0)
Hemoglobin: 12.9 g/dL (ref 12.0–15.0)
MCH: 29.8 pg (ref 26.0–34.0)
MCHC: 32.2 g/dL (ref 30.0–36.0)
MCV: 92.6 fL (ref 80.0–100.0)
Platelets: 282 10*3/uL (ref 150–400)
RBC: 4.33 MIL/uL (ref 3.87–5.11)
RDW: 12.8 % (ref 11.5–15.5)
WBC: 8.8 10*3/uL (ref 4.0–10.5)
nRBC: 0 % (ref 0.0–0.2)

## 2023-02-27 LAB — GLUCOSE, CAPILLARY: Glucose-Capillary: 129 mg/dL — ABNORMAL HIGH (ref 70–99)

## 2023-03-03 ENCOUNTER — Telehealth: Payer: Self-pay | Admitting: *Deleted

## 2023-03-03 NOTE — Anesthesia Preprocedure Evaluation (Signed)
Anesthesia Evaluation  Patient identified by MRN, date of birth, ID band Patient awake    Reviewed: Allergy & Precautions, H&P , NPO status , Patient's Chart, lab work & pertinent test results  Airway Mallampati: III  TM Distance: <3 FB Neck ROM: Full    Dental no notable dental hx. (+) Teeth Intact, Dental Advisory Given   Pulmonary asthma , sleep apnea    Pulmonary exam normal breath sounds clear to auscultation       Cardiovascular hypertension, Pt. on medications Normal cardiovascular exam Rhythm:Regular Rate:Normal     Neuro/Psych  Headaches PSYCHIATRIC DISORDERS Anxiety Depression       GI/Hepatic negative GI ROS, Neg liver ROS,,,  Endo/Other  diabetesHypothyroidism  Class 4 obesity  Renal/GU negative Renal ROS  negative genitourinary   Musculoskeletal  (+) Arthritis ,    Abdominal   Peds negative pediatric ROS (+)  Hematology negative hematology ROS (+)   Anesthesia Other Findings   Reproductive/Obstetrics negative OB ROS                             Anesthesia Physical Anesthesia Plan  ASA: 3  Anesthesia Plan: General   Post-op Pain Management: Minimal or no pain anticipated, Tylenol PO (pre-op)*, Gabapentin PO (pre-op)*, Dilaudid IV and Ketamine IV*   Induction: Intravenous  PONV Risk Score and Plan: 3 and Ondansetron, Dexamethasone and Treatment may vary due to age or medical condition  Airway Management Planned: Oral ETT  Additional Equipment:   Intra-op Plan:   Post-operative Plan: Extubation in OR  Informed Consent: I have reviewed the patients History and Physical, chart, labs and discussed the procedure including the risks, benefits and alternatives for the proposed anesthesia with the patient or authorized representative who has indicated his/her understanding and acceptance.     Dental advisory given  Plan Discussed with: CRNA and Surgeon  Anesthesia  Plan Comments:        Anesthesia Quick Evaluation

## 2023-03-03 NOTE — Telephone Encounter (Signed)
 Telephone call to check on pre-operative status.  Patient compliant with pre-operative instructions.  Reinforced nothing to eat after midnight. Clear liquids until 0515. Patient to arrive at 0615.  No questions or concerns voiced.  Instructed to call for any needs.

## 2023-03-04 ENCOUNTER — Ambulatory Visit (HOSPITAL_BASED_OUTPATIENT_CLINIC_OR_DEPARTMENT_OTHER): Payer: Self-pay | Admitting: Anesthesiology

## 2023-03-04 ENCOUNTER — Other Ambulatory Visit: Payer: Self-pay

## 2023-03-04 ENCOUNTER — Ambulatory Visit (HOSPITAL_COMMUNITY)
Admission: RE | Admit: 2023-03-04 | Discharge: 2023-03-05 | Disposition: A | Discharge status: Home / Self Care | Payer: Commercial Managed Care - HMO | Source: Ambulatory Visit | Attending: Gynecologic Oncology | Admitting: Gynecologic Oncology

## 2023-03-04 ENCOUNTER — Encounter (HOSPITAL_COMMUNITY): Payer: Self-pay | Admitting: Gynecologic Oncology

## 2023-03-04 ENCOUNTER — Ambulatory Visit (HOSPITAL_COMMUNITY): Payer: Self-pay | Admitting: Anesthesiology

## 2023-03-04 ENCOUNTER — Encounter (HOSPITAL_COMMUNITY)
Admission: RE | Disposition: A | Discharge status: Home / Self Care | Payer: Self-pay | Source: Ambulatory Visit | Attending: Gynecologic Oncology

## 2023-03-04 DIAGNOSIS — Z1509 Genetic susceptibility to other malignant neoplasm: Secondary | ICD-10-CM | POA: Diagnosis not present

## 2023-03-04 DIAGNOSIS — E119 Type 2 diabetes mellitus without complications: Secondary | ICD-10-CM

## 2023-03-04 DIAGNOSIS — F32A Depression, unspecified: Secondary | ICD-10-CM | POA: Insufficient documentation

## 2023-03-04 DIAGNOSIS — E039 Hypothyroidism, unspecified: Secondary | ICD-10-CM | POA: Diagnosis not present

## 2023-03-04 DIAGNOSIS — I10 Essential (primary) hypertension: Secondary | ICD-10-CM

## 2023-03-04 DIAGNOSIS — Z7984 Long term (current) use of oral hypoglycemic drugs: Secondary | ICD-10-CM | POA: Diagnosis not present

## 2023-03-04 DIAGNOSIS — Z801 Family history of malignant neoplasm of trachea, bronchus and lung: Secondary | ICD-10-CM | POA: Diagnosis not present

## 2023-03-04 DIAGNOSIS — E6689 Other obesity not elsewhere classified: Secondary | ICD-10-CM | POA: Diagnosis not present

## 2023-03-04 DIAGNOSIS — M1990 Unspecified osteoarthritis, unspecified site: Secondary | ICD-10-CM | POA: Diagnosis not present

## 2023-03-04 DIAGNOSIS — G4730 Sleep apnea, unspecified: Secondary | ICD-10-CM | POA: Insufficient documentation

## 2023-03-04 DIAGNOSIS — Z794 Long term (current) use of insulin: Secondary | ICD-10-CM | POA: Diagnosis not present

## 2023-03-04 DIAGNOSIS — Z6841 Body Mass Index (BMI) 40.0 and over, adult: Secondary | ICD-10-CM | POA: Insufficient documentation

## 2023-03-04 DIAGNOSIS — Z7951 Long term (current) use of inhaled steroids: Secondary | ICD-10-CM | POA: Diagnosis not present

## 2023-03-04 DIAGNOSIS — J45909 Unspecified asthma, uncomplicated: Secondary | ICD-10-CM | POA: Insufficient documentation

## 2023-03-04 DIAGNOSIS — R519 Headache, unspecified: Secondary | ICD-10-CM | POA: Insufficient documentation

## 2023-03-04 DIAGNOSIS — C541 Malignant neoplasm of endometrium: Secondary | ICD-10-CM | POA: Diagnosis present

## 2023-03-04 DIAGNOSIS — F419 Anxiety disorder, unspecified: Secondary | ICD-10-CM | POA: Diagnosis not present

## 2023-03-04 DIAGNOSIS — G4733 Obstructive sleep apnea (adult) (pediatric): Secondary | ICD-10-CM | POA: Diagnosis not present

## 2023-03-04 HISTORY — PX: SENTINEL NODE BIOPSY: SHX6608

## 2023-03-04 HISTORY — PX: ROBOTIC ASSISTED TOTAL HYSTERECTOMY WITH BILATERAL SALPINGO OOPHERECTOMY: SHX6086

## 2023-03-04 LAB — TYPE AND SCREEN
ABO/RH(D): A POS
Antibody Screen: NEGATIVE

## 2023-03-04 LAB — GLUCOSE, CAPILLARY
Glucose-Capillary: 121 mg/dL — ABNORMAL HIGH (ref 70–99)
Glucose-Capillary: 177 mg/dL — ABNORMAL HIGH (ref 70–99)
Glucose-Capillary: 201 mg/dL — ABNORMAL HIGH (ref 70–99)
Glucose-Capillary: 226 mg/dL — ABNORMAL HIGH (ref 70–99)

## 2023-03-04 LAB — ABO/RH: ABO/RH(D): A POS

## 2023-03-04 SURGERY — HYSTERECTOMY, TOTAL, ROBOT-ASSISTED, LAPAROSCOPIC, WITH BILATERAL SALPINGO-OOPHORECTOMY
Anesthesia: General

## 2023-03-04 MED ORDER — ALBUTEROL SULFATE (2.5 MG/3ML) 0.083% IN NEBU
3.0000 mL | INHALATION_SOLUTION | RESPIRATORY_TRACT | Status: DC | PRN
Start: 2023-03-04 — End: 2023-03-05

## 2023-03-04 MED ORDER — SUGAMMADEX SODIUM 200 MG/2ML IV SOLN
INTRAVENOUS | Status: DC | PRN
  Administered 2023-03-04: 200 mg via INTRAVENOUS

## 2023-03-04 MED ORDER — ESCITALOPRAM OXALATE 20 MG PO TABS
20.0000 mg | ORAL_TABLET | Freq: Every day | ORAL | Status: DC
Start: 2023-03-04 — End: 2023-03-05
  Administered 2023-03-04 – 2023-03-05 (×2): 20 mg via ORAL
  Filled 2023-03-04 (×2): qty 1

## 2023-03-04 MED ORDER — KETAMINE HCL 10 MG/ML IJ SOLN
INTRAMUSCULAR | Status: DC | PRN
Start: 2023-03-04 — End: 2023-03-04
  Administered 2023-03-04: 30 mg via INTRAVENOUS
  Administered 2023-03-04: 20 mg via INTRAVENOUS

## 2023-03-04 MED ORDER — MIDAZOLAM HCL 5 MG/5ML IJ SOLN
INTRAMUSCULAR | Status: DC | PRN
  Administered 2023-03-04: 2 mg via INTRAVENOUS

## 2023-03-04 MED ORDER — OXYCODONE HCL 5 MG PO TABS
5.0000 mg | ORAL_TABLET | Freq: Once | ORAL | Status: AC | PRN
  Administered 2023-03-04: 5 mg via ORAL

## 2023-03-04 MED ORDER — OXYCODONE HCL 5 MG PO TABS
5.0000 mg | ORAL_TABLET | ORAL | Status: DC | PRN
Start: 2023-03-04 — End: 2023-03-05

## 2023-03-04 MED ORDER — GABAPENTIN 300 MG PO CAPS
300.0000 mg | ORAL_CAPSULE | ORAL | Status: AC
  Administered 2023-03-04: 300 mg via ORAL
  Filled 2023-03-04: qty 1

## 2023-03-04 MED ORDER — PHENYLEPHRINE HCL (PRESSORS) 10 MG/ML IV SOLN
INTRAVENOUS | Status: AC
  Filled 2023-03-04: qty 1

## 2023-03-04 MED ORDER — BUPIVACAINE HCL 0.25 % IJ SOLN
INTRAMUSCULAR | Status: DC | PRN
  Administered 2023-03-04: 31 mL

## 2023-03-04 MED ORDER — ONDANSETRON HCL 4 MG/2ML IJ SOLN
INTRAMUSCULAR | Status: AC
  Filled 2023-03-04: qty 2

## 2023-03-04 MED ORDER — ROCURONIUM BROMIDE 100 MG/10ML IV SOLN
INTRAVENOUS | Status: DC | PRN
  Administered 2023-03-04: 20 mg via INTRAVENOUS
  Administered 2023-03-04: 60 mg via INTRAVENOUS

## 2023-03-04 MED ORDER — PROPOFOL 10 MG/ML IV BOLUS
INTRAVENOUS | Status: AC
  Filled 2023-03-04: qty 20

## 2023-03-04 MED ORDER — STERILE WATER FOR IRRIGATION IR SOLN
Status: DC | PRN
  Administered 2023-03-04: 1000 mL

## 2023-03-04 MED ORDER — FENTANYL CITRATE PF 50 MCG/ML IJ SOSY
PREFILLED_SYRINGE | INTRAMUSCULAR | Status: AC
  Filled 2023-03-04: qty 1

## 2023-03-04 MED ORDER — METRONIDAZOLE 500 MG/100ML IV SOLN
500.0000 mg | INTRAVENOUS | Status: AC
  Administered 2023-03-04: 500 mg via INTRAVENOUS
  Filled 2023-03-04: qty 100

## 2023-03-04 MED ORDER — ONDANSETRON HCL 4 MG PO TABS: 4.0000 mg | ORAL_TABLET | Freq: Four times a day (QID) | ORAL | Status: DC | PRN

## 2023-03-04 MED ORDER — ENOXAPARIN SODIUM 40 MG/0.4ML IJ SOSY
40.0000 mg | PREFILLED_SYRINGE | INTRAMUSCULAR | Status: DC
  Administered 2023-03-05: 40 mg via SUBCUTANEOUS
  Filled 2023-03-04: qty 0.4

## 2023-03-04 MED ORDER — MIDAZOLAM HCL 2 MG/2ML IJ SOLN
INTRAMUSCULAR | Status: AC
  Filled 2023-03-04: qty 2

## 2023-03-04 MED ORDER — PHENYLEPHRINE HCL (PRESSORS) 10 MG/ML IV SOLN
INTRAVENOUS | Status: AC
Start: 2023-03-04 — End: ?
  Filled 2023-03-04: qty 1

## 2023-03-04 MED ORDER — OXYCODONE HCL 5 MG/5ML PO SOLN: 5.0000 mg | Freq: Once | ORAL | Status: AC | PRN

## 2023-03-04 MED ORDER — CEFAZOLIN SODIUM-DEXTROSE 2-4 GM/100ML-% IV SOLN
2.0000 g | INTRAVENOUS | Status: AC
  Administered 2023-03-04: 2 g via INTRAVENOUS
  Filled 2023-03-04: qty 100

## 2023-03-04 MED ORDER — ACETAMINOPHEN 500 MG PO TABS
1000.0000 mg | ORAL_TABLET | ORAL | Status: AC
  Administered 2023-03-04: 1000 mg via ORAL
  Filled 2023-03-04: qty 2

## 2023-03-04 MED ORDER — TRAMADOL HCL 50 MG PO TABS: 50.0000 mg | ORAL_TABLET | Freq: Four times a day (QID) | ORAL | Status: DC | PRN

## 2023-03-04 MED ORDER — INDOCYANINE GREEN 25 MG IV SOLR
INTRAVENOUS | Status: DC | PRN
  Administered 2023-03-04: 1.25 mg

## 2023-03-04 MED ORDER — DEXAMETHASONE SODIUM PHOSPHATE 4 MG/ML IJ SOLN
INTRAMUSCULAR | Status: DC | PRN
Start: 2023-03-04 — End: 2023-03-04
  Administered 2023-03-04: 5 mg via INTRAVENOUS

## 2023-03-04 MED ORDER — FENTANYL CITRATE (PF) 100 MCG/2ML IJ SOLN
INTRAMUSCULAR | Status: DC | PRN
  Administered 2023-03-04: 100 ug via INTRAVENOUS
  Administered 2023-03-04 (×3): 50 ug via INTRAVENOUS

## 2023-03-04 MED ORDER — ONDANSETRON HCL 4 MG/2ML IJ SOLN: 4.0000 mg | Freq: Four times a day (QID) | INTRAMUSCULAR | Status: DC | PRN

## 2023-03-04 MED ORDER — LACTATED RINGERS IV SOLN: INTRAVENOUS | Status: DC

## 2023-03-04 MED ORDER — STERILE WATER FOR INJECTION IJ SOLN
INTRAMUSCULAR | Status: AC
  Filled 2023-03-04: qty 10

## 2023-03-04 MED ORDER — FENTANYL CITRATE PF 50 MCG/ML IJ SOSY
PREFILLED_SYRINGE | INTRAMUSCULAR | Status: AC
Start: 2023-03-04 — End: 2023-03-04
  Administered 2023-03-04: 50 ug via INTRAVENOUS
  Filled 2023-03-04: qty 3

## 2023-03-04 MED ORDER — BUSPIRONE HCL 5 MG PO TABS
7.5000 mg | ORAL_TABLET | Freq: Two times a day (BID) | ORAL | Status: DC
  Administered 2023-03-04 – 2023-03-05 (×2): 7.5 mg via ORAL
  Filled 2023-03-04 (×2): qty 2

## 2023-03-04 MED ORDER — ACETAMINOPHEN 500 MG PO TABS
1000.0000 mg | ORAL_TABLET | Freq: Once | ORAL | Status: DC
Start: 2023-03-04 — End: 2023-03-04

## 2023-03-04 MED ORDER — PROPOFOL 10 MG/ML IV BOLUS
INTRAVENOUS | Status: DC | PRN
  Administered 2023-03-04: 200 mg via INTRAVENOUS

## 2023-03-04 MED ORDER — ORAL CARE MOUTH RINSE: 15.0000 mL | Freq: Once | OROMUCOSAL | Status: AC

## 2023-03-04 MED ORDER — OXYCODONE HCL 5 MG PO TABS: 5.0000 mg | ORAL_TABLET | ORAL | Status: DC | PRN

## 2023-03-04 MED ORDER — INSULIN ASPART 100 UNIT/ML IJ SOLN: 0.0000 [IU] | INTRAMUSCULAR | Status: DC | PRN

## 2023-03-04 MED ORDER — FENTANYL CITRATE PF 50 MCG/ML IJ SOSY
25.0000 ug | PREFILLED_SYRINGE | INTRAMUSCULAR | Status: DC | PRN
  Administered 2023-03-04 (×2): 50 ug via INTRAVENOUS

## 2023-03-04 MED ORDER — CHLORHEXIDINE GLUCONATE 0.12 % MT SOLN
15.0000 mL | Freq: Once | OROMUCOSAL | Status: AC
  Administered 2023-03-04: 15 mL via OROMUCOSAL

## 2023-03-04 MED ORDER — FENTANYL CITRATE (PF) 250 MCG/5ML IJ SOLN
INTRAMUSCULAR | Status: AC
  Filled 2023-03-04: qty 5

## 2023-03-04 MED ORDER — ROSUVASTATIN CALCIUM 10 MG PO TABS
10.0000 mg | ORAL_TABLET | Freq: Every day | ORAL | Status: DC
  Administered 2023-03-04: 10 mg via ORAL
  Filled 2023-03-04 (×2): qty 1

## 2023-03-04 MED ORDER — ACETAMINOPHEN 500 MG PO TABS
1000.0000 mg | ORAL_TABLET | Freq: Four times a day (QID) | ORAL | Status: DC
  Administered 2023-03-04 – 2023-03-05 (×3): 1000 mg via ORAL
  Filled 2023-03-04 (×3): qty 2

## 2023-03-04 MED ORDER — LACTATED RINGERS IV SOLN
INTRAVENOUS | Status: DC | PRN
Start: 2023-03-04 — End: 2023-03-04

## 2023-03-04 MED ORDER — ACETAMINOPHEN 160 MG/5ML PO SOLN: 325.0000 mg | ORAL | Status: DC | PRN

## 2023-03-04 MED ORDER — INSULIN ASPART 100 UNIT/ML IJ SOLN
0.0000 [IU] | Freq: Three times a day (TID) | INTRAMUSCULAR | Status: DC
Start: 2023-03-04 — End: 2023-03-05
  Administered 2023-03-04: 5 [IU] via SUBCUTANEOUS
  Administered 2023-03-05: 2 [IU] via SUBCUTANEOUS
  Administered 2023-03-05: 3 [IU] via SUBCUTANEOUS

## 2023-03-04 MED ORDER — HEPARIN SODIUM (PORCINE) 5000 UNIT/ML IJ SOLN
5000.0000 [IU] | INTRAMUSCULAR | Status: AC
  Administered 2023-03-04: 5000 [IU] via SUBCUTANEOUS
  Filled 2023-03-04: qty 1

## 2023-03-04 MED ORDER — OXYCODONE HCL 5 MG PO TABS
ORAL_TABLET | ORAL | Status: AC
  Filled 2023-03-04: qty 1

## 2023-03-04 MED ORDER — LIDOCAINE HCL (CARDIAC) PF 100 MG/5ML IV SOSY
PREFILLED_SYRINGE | INTRAVENOUS | Status: DC | PRN
  Administered 2023-03-04: 80 mg via INTRAVENOUS

## 2023-03-04 MED ORDER — MOMETASONE FURO-FORMOTEROL FUM 100-5 MCG/ACT IN AERO
2.0000 | INHALATION_SPRAY | Freq: Two times a day (BID) | RESPIRATORY_TRACT | Status: DC
  Administered 2023-03-04 – 2023-03-05 (×2): 2 via RESPIRATORY_TRACT
  Filled 2023-03-04: qty 8.8

## 2023-03-04 MED ORDER — ACETAMINOPHEN 325 MG PO TABS: 325.0000 mg | ORAL_TABLET | ORAL | Status: DC | PRN

## 2023-03-04 MED ORDER — LEVOTHYROXINE SODIUM 50 MCG PO TABS
175.0000 ug | ORAL_TABLET | Freq: Every day | ORAL | Status: DC
  Administered 2023-03-05: 175 ug via ORAL
  Filled 2023-03-04: qty 1

## 2023-03-04 MED ORDER — BUPIVACAINE HCL 0.25 % IJ SOLN
INTRAMUSCULAR | Status: AC
  Filled 2023-03-04: qty 1

## 2023-03-04 MED ORDER — SCOPOLAMINE 1 MG/3DAYS TD PT72
1.0000 | MEDICATED_PATCH | TRANSDERMAL | Status: DC
  Administered 2023-03-04: 1.5 mg via TRANSDERMAL
  Filled 2023-03-04: qty 1

## 2023-03-04 MED ORDER — DEXAMETHASONE SODIUM PHOSPHATE 10 MG/ML IJ SOLN
INTRAMUSCULAR | Status: AC
  Filled 2023-03-04: qty 1

## 2023-03-04 MED ORDER — KETAMINE HCL 50 MG/5ML IJ SOSY
PREFILLED_SYRINGE | INTRAMUSCULAR | Status: AC
  Filled 2023-03-04: qty 5

## 2023-03-04 MED ORDER — ONDANSETRON HCL 4 MG/2ML IJ SOLN: 4.0000 mg | Freq: Once | INTRAMUSCULAR | Status: DC | PRN

## 2023-03-04 MED ORDER — SUMATRIPTAN SUCCINATE 50 MG PO TABS
50.0000 mg | ORAL_TABLET | ORAL | Status: DC | PRN
Start: 2023-03-04 — End: 2023-03-05

## 2023-03-04 MED ORDER — ONDANSETRON HCL 4 MG/2ML IJ SOLN
INTRAMUSCULAR | Status: DC | PRN
  Administered 2023-03-04: 4 mg via INTRAVENOUS

## 2023-03-04 MED ORDER — HYDROMORPHONE HCL 1 MG/ML IJ SOLN: 0.5000 mg | INTRAMUSCULAR | Status: DC | PRN

## 2023-03-04 MED ORDER — MEPERIDINE HCL 50 MG/ML IJ SOLN: 6.2500 mg | INTRAMUSCULAR | Status: DC | PRN

## 2023-03-04 SURGICAL SUPPLY — 72 items
APPLICATOR SURGIFLO ENDO (HEMOSTASIS) IMPLANT
BAG COUNTER SPONGE SURGICOUNT (BAG) IMPLANT
BAG LAPAROSCOPIC 12 15 PORT 16 (BASKET) IMPLANT
BAG RETRIEVAL 12/15 (BASKET) ×2
BLADE SURG SZ10 CARB STEEL (BLADE) IMPLANT
COVER BACK TABLE 60X90IN (DRAPES) ×3 IMPLANT
COVER TIP SHEARS 8 DVNC (MISCELLANEOUS) ×3 IMPLANT
DERMABOND ADVANCED .7 DNX12 (GAUZE/BANDAGES/DRESSINGS) ×3 IMPLANT
DRAPE ARM DVNC X/XI (DISPOSABLE) ×12 IMPLANT
DRAPE COLUMN DVNC XI (DISPOSABLE) ×3 IMPLANT
DRAPE SHEET LG 3/4 BI-LAMINATE (DRAPES) ×3 IMPLANT
DRAPE SURG IRRIG POUCH 19X23 (DRAPES) ×3 IMPLANT
DRIVER NDL MEGA SUTCUT DVNCXI (INSTRUMENTS) ×3 IMPLANT
DRIVER NDLE MEGA SUTCUT DVNCXI (INSTRUMENTS) ×2
DRSG OPSITE POSTOP 4X6 (GAUZE/BANDAGES/DRESSINGS) IMPLANT
DRSG OPSITE POSTOP 4X8 (GAUZE/BANDAGES/DRESSINGS) IMPLANT
ELECT PENCIL ROCKER SW 15FT (MISCELLANEOUS) IMPLANT
ELECT REM PT RETURN 15FT ADLT (MISCELLANEOUS) ×3 IMPLANT
FORCEPS BPLR FENES DVNC XI (FORCEP) ×3 IMPLANT
FORCEPS PROGRASP DVNC XI (FORCEP) ×3 IMPLANT
GAUZE 4X4 16PLY ~~LOC~~+RFID DBL (SPONGE) ×6 IMPLANT
GLOVE BIO SURGEON STRL SZ 6 (GLOVE) ×12 IMPLANT
GLOVE BIO SURGEON STRL SZ 6.5 (GLOVE) ×3 IMPLANT
GOWN STRL REUS W/ TWL LRG LVL3 (GOWN DISPOSABLE) ×12 IMPLANT
GRASPER SUT TROCAR 14GX15 (MISCELLANEOUS) IMPLANT
HOLDER FOLEY CATH W/STRAP (MISCELLANEOUS) IMPLANT
IRRIG SUCT STRYKERFLOW 2 WTIP (MISCELLANEOUS) ×2
IRRIGATION SUCT STRKRFLW 2 WTP (MISCELLANEOUS) ×3 IMPLANT
KIT PROCEDURE DVNC SI (MISCELLANEOUS) IMPLANT
KIT TURNOVER KIT A (KITS) IMPLANT
LIGASURE IMPACT 36 18CM CVD LR (INSTRUMENTS) IMPLANT
MANIPULATOR ADVINCU DEL 3.0 PL (MISCELLANEOUS) IMPLANT
MANIPULATOR ADVINCU DEL 3.5 PL (MISCELLANEOUS) IMPLANT
MANIPULATOR UTERINE 4.5 ZUMI (MISCELLANEOUS) IMPLANT
NDL HYPO 21X1.5 SAFETY (NEEDLE) ×3 IMPLANT
NDL SPNL 18GX3.5 QUINCKE PK (NEEDLE) IMPLANT
NEEDLE HYPO 21X1.5 SAFETY (NEEDLE) ×2
NEEDLE SPNL 18GX3.5 QUINCKE PK (NEEDLE)
OBTURATOR OPTICAL STND 8 DVNC (TROCAR) ×2
OBTURATOR OPTICALSTD 8 DVNC (TROCAR) ×3 IMPLANT
PACK ROBOT GYN CUSTOM WL (TRAY / TRAY PROCEDURE) ×3 IMPLANT
PACKING VAGINAL (PACKING) IMPLANT
PAD POSITIONING PINK XL (MISCELLANEOUS) ×3 IMPLANT
PORT ACCESS TROCAR AIRSEAL 12 (TROCAR) IMPLANT
SCISSORS MNPLR CVD DVNC XI (INSTRUMENTS) ×3 IMPLANT
SCRUB CHG 4% DYNA-HEX 4OZ (MISCELLANEOUS) IMPLANT
SEAL UNIV 5-12 XI (MISCELLANEOUS) ×12 IMPLANT
SET TRI-LUMEN FLTR TB AIRSEAL (TUBING) ×3 IMPLANT
SPIKE FLUID TRANSFER (MISCELLANEOUS) ×3 IMPLANT
SPONGE T-LAP 18X18 ~~LOC~~+RFID (SPONGE) IMPLANT
SURGIFLO W/THROMBIN 8M KIT (HEMOSTASIS) IMPLANT
SUT MNCRL AB 4-0 PS2 18 (SUTURE) IMPLANT
SUT PDS AB 1 TP1 96 (SUTURE) IMPLANT
SUT STRATA PDS 0 30 CT-2.5 (SUTURE) IMPLANT
SUT VIC AB 0 CT1 27XBRD ANTBC (SUTURE) IMPLANT
SUT VIC AB 2-0 CT1 TAPERPNT 27 (SUTURE) IMPLANT
SUT VIC AB 2-0 SH 27X BRD (SUTURE) IMPLANT
SUT VIC AB 2-0 SH 27XBRD (SUTURE) IMPLANT
SUT VIC AB 4-0 PS2 18 (SUTURE) ×6 IMPLANT
SUT VICRYL 0 27 CT2 27 ABS (SUTURE) ×3 IMPLANT
SUT VLOC 180 0 9IN GS21 (SUTURE) IMPLANT
SYR 10ML LL (SYRINGE) IMPLANT
SYS BAG RETRIEVAL 10MM (BASKET)
SYS WOUND ALEXIS 18CM MED (MISCELLANEOUS)
SYSTEM BAG RETRIEVAL 10MM (BASKET) IMPLANT
SYSTEM WOUND ALEXIS 18CM MED (MISCELLANEOUS) IMPLANT
TRAP SPECIMEN MUCUS 40CC (MISCELLANEOUS) IMPLANT
TRAY FOLEY MTR SLVR 16FR STAT (SET/KITS/TRAYS/PACK) ×3 IMPLANT
TROCAR PORT AIRSEAL 5X120 (TROCAR) IMPLANT
UNDERPAD 30X36 HEAVY ABSORB (UNDERPADS AND DIAPERS) ×6 IMPLANT
WATER STERILE IRR 1000ML POUR (IV SOLUTION) ×3 IMPLANT
YANKAUER SUCT BULB TIP 10FT TU (MISCELLANEOUS) IMPLANT

## 2023-03-04 NOTE — H&P (Signed)
Gynecologic Oncology H&P  Treatment History: The patient reports having normal menses until a year and a half ago.  At that time, she began skipping up to several months between menses.  She denies any intermenstrual bleeding.  More recently, in March of this year, she began having spotting most days.  She describes this as blood when she wipes not requiring the use of a pad.  She continued to have more menstrual-like bleeding every 2-3 months.  She had a menstrual cycle in June, again after her Pap test in September, and again after her endometrial biopsy.   Pap test on 10/10/2022 revealed atypical glandular cells of undetermined significance.  High risk HPV negative.   Endometrial biopsy performed on 10/29 revealed well-differentiated endometrial adenocarcinoma.  Cervical biopsy and endocervical curettings showed benign ecto and endocervical tissue.   CT of abdomen on 12/04/2022 reveals no evidence of metastatic disease within the abdomen.  2.1 cm exophytic hypodense lesion within the left lower pole of the kidney, indeterminant.  Mild hepatomegaly and mild hepatic steatosis.  Bilateral nonobstructing nephrolithiasis.   MRI of the pelvis on 12/09/2022 reveals hypoenhancing masslike appearance of the endometrium extending into the endocervical canal, measuring up to 6.2 cm.  No evidence of myometrial invasion.  Prominent subcentimeter left pelvic sidewall lymph node, modestly suspicious for early nodal metastatic disease.  Trace free fluid.  Sigmoid diverticulosis.   In the setting of a hemoglobin A1c of 12% on 12/09/22, decision made to proceed with D&C with Mirena IUD insertion while we work on glycemic control.   12/17/22: Hysteroscopy with endometrial sampling using Myosure device, Tilt test, Mirena IUD insertion (Lot #[id number], exp 11/2024) Pathology reveals FIGO grade 1 endometrioid adenocarcinoma, MMR shows loss of MLH1 and PMS2.   PET scan was performed on 01/01/2023 showing intense  hypermetabolism throughout the endometrium.  No hypermetabolic metastatic disease.  Again indeterminate 1.9 cm left upper renal cortex lesion noted, increased from CT scan in 2017.  Interval History: Doing well. Good glycemic control.   Past Medical/Surgical History: Past Medical History:  Diagnosis Date   Anxiety    Arthritis    osteoarthritis   Asthma    Depression    Situational   Eczema    Endometrial cancer (HCC)    Headache    hx of migraines   History of kidney stones    Hx of migraine headaches    Hypertension    Hypothyroidism    OSA (obstructive sleep apnea)    wears cpap   Type 2 diabetes mellitus (HCC)    Tresbia, metformin    Past Surgical History:  Procedure Laterality Date   DILATATION & CURETTAGE/HYSTEROSCOPY WITH MYOSURE N/A 12/17/2022   Procedure: DILATATION & CURETTAGE/HYSTEROSCOPY WITH MYOSURE;  Surgeon: Carver Fila, MD;  Location: [facility];  Service: Gynecology;  Laterality: N/A;   EXTRACORPOREAL SHOCK WAVE LITHOTRIPSY Left 01/17/2016   Procedure: EXTRACORPOREAL SHOCK WAVE LITHOTRIPSY (ESWL);  Surgeon: Alfredo Martinez, MD;  Location: [facility];  Service: Urology;  Laterality: Left;   FOOT SURGERY     mid foot fusion  and screw removal   HAND SURGERY     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   INTRAUTERINE DEVICE (IUD) INSERTION N/A 12/17/2022   Procedure: INTRAUTERINE DEVICE (IUD) INSERTION MIRENA, TILT TEST;  Surgeon: Carver Fila, MD;  Location: [facility];  Service: Gynecology;  Laterality: N/A;   LASIK Bilateral 2002   LITHOTRIPSY  2001   URETEROSCOPY WITH HOLMIUM LASER LITHOTRIPSY Right 2008 x2   WISDOM  TOOTH EXTRACTION Bilateral 2002    Family History  Problem Relation Age of Onset   Lung cancer Father    COPD Sister    Endometrial cancer Sister    Lung cancer Paternal Aunt    Lung cancer Paternal Grandmother    Breast cancer Neg Hx    Ovarian cancer Neg Hx    Pancreatic cancer Neg Hx    Colon cancer Neg Hx    Prostate cancer Neg  Hx     Social History   Socioeconomic History   Marital status: Single    Spouse name: Not on file   Number of children: Not on file   Years of education: Not on file   Highest education level: Not on file  Occupational History   Not on file  Tobacco Use   Smoking status: Never    Passive exposure: Never   Smokeless tobacco: Never  Vaping Use   Vaping status: Never Used  Substance and Sexual Activity   Alcohol use: Not Currently    Comment: 2-3 drinks per week   Drug use: No   Sexual activity: Not Currently    Birth control/protection: Post-menopausal  Other Topics Concern   Not on file  Social History Narrative   Not on file   Social Drivers of Health   Financial Resource Strain: Not on file  Food Insecurity: No Food Insecurity (11/26/2022)   Hunger Vital Sign    Worried About Running Out of Food in the Last Year: Never true    Ran Out of Food in the Last Year: Never true  Transportation Needs: Unknown (11/26/2022)   PRAPARE - Administrator, Civil Service (Medical): Not on file    Lack of Transportation (Non-Medical): No  Physical Activity: Not on file  Stress: Not on file  Social Connections: Not on file    Current Medications:  Current Facility-Administered Medications:    ceFAZolin (ANCEF) IVPB 2g/100 mL premix, 2 g, Intravenous, On Call to OR, Cross, Melissa D, NP   insulin aspart (novoLOG) injection 0-14 Units, 0-14 Units, Subcutaneous, Q2H PRN, Bethena Midget, MD   lactated ringers infusion, , Intravenous, Continuous, Bethena Midget, MD, Last Rate: 75 mL/hr at 03/04/23 0719, New Bag at 03/04/23 0719   metroNIDAZOLE (FLAGYL) IVPB 500 mg, 500 mg, Intravenous, On Call to OR, Cross, Melissa D, NP   scopolamine (TRANSDERM-SCOP) 1 MG/3DAYS 1.5 mg, 1 patch, Transdermal, On Call to OR, Cross, Melissa D, NP, 1.5 mg at 03/04/23 0715  Review of Systems: Denies appetite changes, fevers, chills, fatigue, unexplained weight changes. Denies hearing loss,  neck lumps or masses, mouth sores, ringing in ears or voice changes. Denies cough or wheezing.  Denies shortness of breath. Denies chest pain or palpitations. Denies leg swelling. Denies abdominal distention, pain, blood in stools, constipation, diarrhea, nausea, vomiting, or early satiety. Denies pain with intercourse, dysuria, frequency, hematuria or incontinence. Denies hot flashes, pelvic pain, vaginal bleeding or vaginal discharge.   Denies joint pain, back pain or muscle pain/cramps. Denies itching, rash, or wounds. Denies dizziness, headaches, numbness or seizures. Denies swollen lymph nodes or glands, denies easy bruising or bleeding. Denies anxiety, depression, confusion, or decreased concentration.  Physical Exam: BP 134/76   Pulse 88   Temp 98.6 F (37 C) (Oral)   Resp 16   Wt 264 lb (119.7 kg)   LMP 12/06/2022   SpO2 94%   BMI 48.29 kg/m  General: Alert, oriented, no acute distress. HEENT: Posterior oropharynx clear, sclera anicteric. Chest:  Unlabored breathing on room air.  Laboratory & Radiologic Studies: PET 12/20: 1. Intense hypermetabolism throughout the endometrium, compatible with known primary endometrial malignancy. 2. No hypermetabolic metastatic disease. 3. Indeterminate exophytic isodense 1.9 cm lateral upper left renal cortical lesion, increased from 1.1 cm on 01/04/2016 CT, cannot exclude renal neoplasm. Recommend further evaluation with abdominal MRI without and with IV contrast. 4. Chronic findings include: Diffuse hepatic steatosis. Nonobstructing bilateral renal stones. Small to moderate fat containing supraumbilical ventral abdominal hernia near the midline. Aortic Atherosclerosis (ICD10-I70.0).   Assessment & Plan: Amanda Spence is a 53 y.o. 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 MMRd. MHL1 hypermethylation present. Hormonal treatment initiated in December  secondary to hyperglycemia. Imaging shows no evidence of myometrial invasion, no definitive evidence of metastatic disease.  Plan for definitive surgery doing given improved glycemic control. See counseling from 01/23/23.  Eugene Garnet, MD  Division of Gynecologic Oncology  Department of Obstetrics and Gynecology  [facility]

## 2023-03-04 NOTE — Anesthesia Procedure Notes (Signed)
Procedure Name: Intubation Date/Time: 03/04/2023 9:10 AM  Performed by: Deri Fuelling, CRNAPre-anesthesia Checklist: Patient identified, Emergency Drugs available, Suction available and Patient being monitored Patient Re-evaluated:Patient Re-evaluated prior to induction Oxygen Delivery Method: Circle system utilized Preoxygenation: Pre-oxygenation with 100% oxygen Induction Type: IV induction Ventilation: Mask ventilation without difficulty Laryngoscope Size: Glidescope and 3 Grade View: Grade I Tube type: Oral Tube size: 7.0 mm Number of attempts: 1 Airway Equipment and Method: Stylet and Oral airway Placement Confirmation: ETT inserted through vocal cords under direct vision, positive ETCO2 and breath sounds checked- equal and bilateral Secured at: 22 cm Tube secured with: Tape Dental Injury: Teeth and Oropharynx as per pre-operative assessment

## 2023-03-04 NOTE — Discharge Instructions (Addendum)
AFTER SURGERY INSTRUCTIONS   Return to work: 4-6 weeks if applicable  There were some small abrasions/tears in the vagina with delivering the uterus through the vagina Stitches have been placed in these areas. They will dissolve on their own. You may have some soreness from these areas. When using the bathroom, pat the vulva dry.   Activity: 1. Be up and out of the bed during the day.  Take a nap if needed.  You may walk up steps but be careful and use the hand rail.  Stair climbing will tire you more than you think, you may need to stop part way and rest.    2. No lifting or straining for 6 weeks over 10 pounds. No pushing, pulling, straining for 6 weeks.   3. No driving for 2-95 days when the following criteria have been met: Do not drive if you are taking narcotic pain medicine and make sure that your reaction time has returned.    4. You can shower as soon as the next day after surgery. Shower daily.  Use your regular soap and water (not directly on the incision) and pat your incision(s) dry afterwards; don't rub.  No tub baths or submerging your body in water until cleared by your surgeon. If you have the soap that was given to you by pre-surgical testing that was used before surgery, you do not need to use it afterwards because this can irritate your incisions.    5. No sexual activity and nothing in the vagina for 10-12 weeks.   6. You may experience a small amount of clear drainage from your incisions, which is normal.  If the drainage persists, increases, or changes color please call the office.   7. Do not use creams, lotions, or ointments such as neosporin on your incisions after surgery until advised by your surgeon because they can cause removal of the dermabond glue on your incisions.     8. You may experience vaginal spotting after surgery or when the stitches at the top of the vagina begin to dissolve.  The spotting is normal but if you experience heavy bleeding, call our  office.   9. Take Tylenol or ibuprofen first for pain if you are able to take these medications and only use narcotic pain medication for severe pain not relieved by the Tylenol or Ibuprofen.  Monitor your Tylenol intake to a max of 4,000 mg in a 24 hour period. You can alternate these medications after surgery.   Diet: 1. Low sodium Heart Healthy Diet is recommended but you are cleared to resume your normal (before surgery) diet after your procedure.   2. It is safe to use a laxative, such as Miralax or Colace, if you have difficulty moving your bowels before surgery. You have been prescribed Sennakot-S to take at bedtime every evening after surgery to keep bowel movements regular and to prevent constipation.     Wound Care: 1. Keep clean and dry.  Shower daily.   Reasons to call the Doctor: Fever - Oral temperature greater than 100.4 degrees Fahrenheit Foul-smelling vaginal discharge Difficulty urinating Nausea and vomiting Increased pain at the site of the incision that is unrelieved with pain medicine. Difficulty breathing with or without chest pain New calf pain especially if only on one side Sudden, continuing increased vaginal bleeding with or without clots.   Contacts: For questions or concerns you should contact:   Dr. Eugene Garnet at [phone]   Warner Mccreedy, NP at [phone]  After Hours: call [phone] and have the GYN Oncologist paged/contacted (after 5 pm or on the weekends). You will speak with an after hours RN and let he or she know you have had surgery.   Messages sent via mychart are for non-urgent matters and are not responded to after hours so for urgent needs, please call the after hours number.

## 2023-03-04 NOTE — Op Note (Signed)
OPERATIVE NOTE  Pre-operative Diagnosis: endometrial cancer grade 1  Post-operative Diagnosis: same  Operation: Robotic-assisted laparoscopic total hysterectomy with bilateral salpingoophorectomy, SLN biopsy bilaterally, vaginal laceration repair Extreme morbid obesity requiring additional OR personnel for positioning and retraction. Obesity made retroperitoneal visualization limited and increased the complexity of the case and necessitated additional instrumentation for retraction. Obesity related complexity increased the duration of the procedure by 45 minutes.   Surgeon: Eugene Garnet MD  Assistant Surgeon: Warner Mccreedy, NP (an NP assistant was necessary for tissue manipulation, management of robotic instrumentation, retraction and positioning due to the complexity of the case and hospital policies).   Anesthesia: GET  Urine Output: 150 cc  Operative Findings: 8 cm mobile uterus on EUA, narrow vaginal introitus and vagina. On intra-abdominal entry, normal upper abdominal survey. Small area of omentum adherent to the inferior aspect of the falciform ligament. Normal small and large bowel. Uterus 8-10 cm, somewhat bulbous. Normal appearing adnexa. Significant retroperitoneal adiposity. No adenopathy. Mapping successful to bilateral obturator SLNs, right external iliac SLN.   Estimated Blood Loss:  100 cc      Total IV Fluids: see I&O flowsheet         Specimens: uterus, cervix, bilateral tubes and ovaries, bilateral obturator SLNs, right external iliac SLN         Complications:  None apparent; patient tolerated the procedure well.         Disposition: PACU - hemodynamically stable.  Procedure Details  The patient was seen in the Holding Room. The risks, benefits, complications, treatment options, and expected outcomes were discussed with the patient.  The patient concurred with the proposed plan, giving informed consent.  The site of surgery properly noted/marked. The patient was  identified as Amanda Spence and the procedure verified as a Robotic-assisted hysterectomy with bilateral salpingo oophorectomy with SLN biopsy.   After induction of anesthesia, the patient was draped and prepped in the usual sterile manner. Patient was placed in supine position after anesthesia and draped and prepped in the usual sterile manner as follows: Her arms were tucked to her side with all appropriate precautions.  The patient was secured to the bed using padding and tape across her chest.  The patient was placed in the semi-lithotomy position in [place] stirrups.  The perineum and vagina were prepped with CHG. The patient's abdomen was prepped with ChloraPrep and she was draped after the prep had been allowed to dry for 3 minutes.  A Time Out was held and the above information confirmed.  The urethra was prepped with Betadine. Foley catheter was placed.  A sterile speculum was placed in the vagina.  The cervix was grasped with a single-tooth tenaculum. 2mg  total of ICG was injected into the cervical stroma at 2 and 9 o'clock with 1cc injected at a 1cm and 2mm depth (concentration 0.5mg /ml) in all locations. The cervix was dilated with Shawnie Pons dilators.  The ZUMI uterine manipulator with a small colpotomizer ring was placed without difficulty.  A pneum occluder balloon was placed over the manipulator.  OG tube placement was confirmed and to suction.   Next, a 10 mm skin incision was made 1 cm below the subcostal margin in the midclavicular line.  The 5 mm Optiview port and scope was used for direct entry.  Opening pressure was under 10 mm CO2.  The abdomen was insufflated and the findings were noted as above.   At this point and all points during the procedure, the patient's intra-abdominal pressure did not exceed  15 mmHg. Next, an 8 mm skin incision was made just superior to the umbilicus and a right and left port were placed about 8 cm lateral to the robot port on the right and left side.  A fourth arm  was placed on the right.  The 5 mm assist trocar was exchanged for a 12 mm airseal port. All ports were placed under direct visualization.  The patient was placed in steep Trendelenburg.  Bowel was folded away into the upper abdomen.  The robot was docked in the normal manner.  The right and left peritoneum were opened parallel to the IP ligament to open the retroperitoneal spaces bilaterally. The round ligaments were transected. The SLN mapping was performed in bilateral pelvic basins. After identifying the ureters, the para rectal and paravesical spaces were opened up entirely with careful dissection below the level of the ureters bilaterally and to the depth of the uterine artery origin in order to skeletonize the uterine "web" and ensure visualization of all parametrial channels. The para-aortic basins were carefully exposed and evaluated for isolated para-aortic SLN's. Lymphatic channels were identified travelling to the following visualized sentinel lymph nodes: bilateral obturator and right external iliac. These SLNs were separated from their surrounding lymphatic tissue, removed and sent for permanent pathology.  The hysterectomy was started.  The ureter was again noted to be on the medial leaf of the broad ligament.  The peritoneum above the ureter was incised and stretched and the infundibulopelvic ligament was skeletonized, cauterized and cut.  The posterior peritoneum was taken down to the level of the KOH ring.  The anterior peritoneum was also taken down.  The bladder flap was created to the level of the KOH ring.  The uterine artery on the right side was skeletonized, cauterized and cut in the normal manner.  A similar procedure was performed on the left.  The colpotomy was made and the uterus, cervix, bilateral ovaries and tubes were amputated, placed in an Endocatch bag and delivered through the vagina.  Pedicles were inspected and excellent hemostasis was achieved.    The colpotomy at the  vaginal cuff was closed with 0 Vicryl on a CT1 needle with a figure of eight at each apex and 0 V-Loc to close the midportion of the cuff in 2 layers in a running manner.  Irrigation was used and excellent hemostasis was achieved.  Intra-abdominal pressure was decreased to 5 mm Hg with excellent hemostasis maintained. At this point in the procedure was completed.  Robotic instruments were removed under direct visulaization.  The robot was undocked. The fascia at the 10-12 mm port was closed with 0 Vicryl using a PMI fascial closure device under direct visualization.  The subcuticular tissue was closed with 4-0 Vicryl and the skin was closed with 4-0 Monocryl in a subcuticular manner.  Dermabond was applied.    The vagina was swabbed with bladder noted from the upper left vaginal sidewall, peri-urethra, and posterior distal vagina. These areas were all made hemostatic using 2-0 Vicryl with either figure of eight stitches or running locked stitches. The vagina was then packed with moist vaginal packing. Foley catheter was left in place.  All sponge, lap and needle counts were correct x  3.   The patient was transferred to the recovery room in stable condition.  Eugene Garnet, MD

## 2023-03-04 NOTE — Anesthesia Postprocedure Evaluation (Signed)
Anesthesia Post Note  Patient: EARA BURRUEL  Procedure(s) Performed: XI ROBOTIC ASSISTED TOTAL HYSTERECTOMY WITH BILATERAL SALPINGO OOPHORECTOMY (Bilateral) SENTINEL NODE BIOPSY     Patient location during evaluation: PACU Anesthesia Type: General Level of consciousness: awake and alert Pain management: pain level controlled Vital Signs Assessment: post-procedure vital signs reviewed and stable Respiratory status: spontaneous breathing, nonlabored ventilation, respiratory function stable and patient connected to nasal cannula oxygen Cardiovascular status: blood pressure returned to baseline and stable Postop Assessment: no apparent nausea or vomiting Anesthetic complications: no   No notable events documented.  Last Vitals:  Vitals:   03/04/23 1115 03/04/23 1130  BP: 125/72 (!) 140/81  Pulse: 89 94  Resp: (!) 27 (!) 9  Temp:    SpO2: 100% 100%    Last Pain:  Vitals:   03/04/23 0653  TempSrc:   PainSc: 0-No pain                 Asani Mcburney

## 2023-03-04 NOTE — Transfer of Care (Signed)
Immediate Anesthesia Transfer of Care Note  Patient: Amanda Spence  Procedure(s) Performed: XI ROBOTIC ASSISTED TOTAL HYSTERECTOMY WITH BILATERAL SALPINGO OOPHORECTOMY (Bilateral) SENTINEL NODE BIOPSY  Patient Location: PACU  Anesthesia Type:General  Level of Consciousness: sedated and drowsy  Airway & Oxygen Therapy: Patient Spontanous Breathing and Patient connected to face mask oxygen  Post-op Assessment: Report given to RN and Post -op Vital signs reviewed and stable  Post vital signs: Reviewed and stable  Last Vitals:  Vitals Value Taken Time  BP 128/70 03/04/23 1110  Temp    Pulse 89 03/04/23 1115  Resp 27 03/04/23 1115  SpO2 100 % 03/04/23 1115  Vitals shown include unfiled device data.  Last Pain:  Vitals:   03/04/23 0653  TempSrc:   PainSc: 0-No pain         Complications: No notable events documented.

## 2023-03-05 ENCOUNTER — Encounter (HOSPITAL_COMMUNITY): Payer: Self-pay | Admitting: Gynecologic Oncology

## 2023-03-05 ENCOUNTER — Encounter: Payer: Self-pay | Admitting: Gynecologic Oncology

## 2023-03-05 DIAGNOSIS — C541 Malignant neoplasm of endometrium: Secondary | ICD-10-CM | POA: Diagnosis not present

## 2023-03-05 LAB — CBC
HCT: 40 % (ref 36.0–46.0)
Hemoglobin: 12.6 g/dL (ref 12.0–15.0)
MCH: 29.2 pg (ref 26.0–34.0)
MCHC: 31.5 g/dL (ref 30.0–36.0)
MCV: 92.8 fL (ref 80.0–100.0)
Platelets: 275 10*3/uL (ref 150–400)
RBC: 4.31 MIL/uL (ref 3.87–5.11)
RDW: 12.5 % (ref 11.5–15.5)
WBC: 9.6 10*3/uL (ref 4.0–10.5)
nRBC: 0 % (ref 0.0–0.2)

## 2023-03-05 LAB — GLUCOSE, CAPILLARY
Glucose-Capillary: 136 mg/dL — ABNORMAL HIGH (ref 70–99)
Glucose-Capillary: 184 mg/dL — ABNORMAL HIGH (ref 70–99)

## 2023-03-05 LAB — BASIC METABOLIC PANEL
Anion gap: 10 (ref 5–15)
BUN: 17 mg/dL (ref 6–20)
CO2: 26 mmol/L (ref 22–32)
Calcium: 9.2 mg/dL (ref 8.9–10.3)
Chloride: 103 mmol/L (ref 98–111)
Creatinine, Ser: 0.72 mg/dL (ref 0.44–1.00)
GFR, Estimated: >60 mL/min (ref >60)
Glucose, Bld: 141 mg/dL — ABNORMAL HIGH (ref 70–99)
Potassium: 4.3 mmol/L (ref 3.5–5.1)
Sodium: 139 mmol/L (ref 135–145)

## 2023-03-05 NOTE — Progress Notes (Signed)
GYN Oncology Progress Note  Patient alert, oriented, in no acute distress, resting in bed. Vaginal packing removed without difficulty with no significant blood noted on packing. Peri pad applied. Foley to remain in place for the next two hours while patient ambulates etc then in no significant bleeding, the foley can be removed.

## 2023-03-05 NOTE — Discharge Summary (Signed)
Physician Discharge Summary  Patient ID: Amanda Spence MRN: [id number] DOB/AGE: [id number] 55 y.o.  Admit date: 03/04/2023 Discharge date: 03/05/2023  Admission Diagnoses: Endometrial cancer [facility])  Discharge Diagnoses:  Principal Problem:   Endometrial cancer [facility])   Discharged Condition:  The patient is in good condition and stable for discharge.  {condition:18240}  Hospital Course: ***  Consults: {consultation:18241}  Significant Diagnostic Studies: {diagnostics:18242}  Treatments: {Tx:18249}  Discharge Exam: Blood pressure 137/76, pulse 87, temperature 98.8 F (37.1 C), temperature source Oral, resp. rate 16, weight 264 lb (119.7 kg), last menstrual period 12/06/2022, SpO2 97%. {physical VOZD:6644034}  Disposition: Discharge disposition: 01-Home or Self Care       Discharge Instructions     Call MD for:  difficulty breathing, headache or visual disturbances   Complete by: As directed    Call MD for:  difficulty breathing, headache or visual disturbances   Complete by: As directed    Call MD for:  extreme fatigue   Complete by: As directed    Call MD for:  extreme fatigue   Complete by: As directed    Call MD for:  hives   Complete by: As directed    Call MD for:  hives   Complete by: As directed    Call MD for:  persistant dizziness or light-headedness   Complete by: As directed    Call MD for:  persistant dizziness or light-headedness   Complete by: As directed    Call MD for:  persistant nausea and vomiting   Complete by: As directed    Call MD for:  persistant nausea and vomiting   Complete by: As directed    Call MD for:  redness, tenderness, or signs of infection (pain, swelling, redness, odor or green/yellow discharge around incision site)   Complete by: As directed    Call MD for:  redness, tenderness, or signs of infection (pain, swelling, redness, odor or green/yellow discharge around incision site)   Complete by: As directed    Call MD  for:  severe uncontrolled pain   Complete by: As directed    Call MD for:  severe uncontrolled pain   Complete by: As directed    Call MD for:  temperature >100.4   Complete by: As directed    Call MD for:  temperature >100.4   Complete by: As directed    Diet - low sodium heart healthy   Complete by: As directed    Diet - low sodium heart healthy   Complete by: As directed    Driving Restrictions   Complete by: As directed    No driving for 7-42 days when the following criteria have been met.  Do not take narcotics and drive. You need to make sure your reaction time has returned.   Driving Restrictions   Complete by: As directed    No driving for 5-95 days until the following have been met.  Do not take narcotics and drive. You need to make sure your reaction time has returned.   Increase activity slowly   Complete by: As directed    Increase activity slowly   Complete by: As directed    Lifting restrictions   Complete by: As directed    No lifting greater than 10 lbs, pushing, pulling, straining for 6 weeks.   Lifting restrictions   Complete by: As directed    No lifting greater than 10 lbs, pushing, pulling, straining for 6 weeks.   Sexual Activity Restrictions  Complete by: As directed    No sexual activity, nothing in the vagina, for 10-12 weeks.   Sexual Activity Restrictions   Complete by: As directed    No sexual activity, nothing in the vagina, for 10-12 weeks.      Allergies as of 03/05/2023       Reactions   Bee Venom Hives, Itching, Other (See Comments), Rash, Swelling   Biaxin [clarithromycin] Nausea And Vomiting   Metal taste in mouth   Pineapple Other (See Comments)   Mouth sores   Sesame Seed (diagnostic) Nausea Only        Medication List     TAKE these medications    albuterol 108 (90 Base) MCG/ACT inhaler Commonly known as: VENTOLIN HFA Inhale 2 puffs into the lungs every 4 (four) hours as needed for wheezing or shortness of breath.    Allegra-D Allergy & Congestion 180-240 MG 24 hr tablet Generic drug: fexofenadine-pseudoephedrine Take 1 tablet by mouth daily as needed (allergies/congestion).   busPIRone 7.5 MG tablet Commonly known as: BUSPAR Take 7.5 mg by mouth 2 (two) times daily.   Cholecalciferol 50 MCG (2000 UT) Caps Take 2,000 Units by mouth daily.   Cinnamon 500 MG capsule Take 500 mg by mouth daily.   EPINEPHrine 0.3 mg/0.3 mL Soaj injection Commonly known as: EPI-PEN Inject 0.3 mg into the muscle as needed for anaphylaxis.   escitalopram 20 MG tablet Commonly known as: LEXAPRO Take 20 mg by mouth daily.   Flonase Allergy Relief 50 MCG/ACT nasal spray Generic drug: fluticasone Place 2 sprays into both nostrils daily as needed for allergies.   levothyroxine 175 MCG tablet Commonly known as: SYNTHROID Take 175 mcg by mouth daily before breakfast.   lisinopril 10 MG tablet Commonly known as: ZESTRIL Take 10 mg by mouth daily.   metFORMIN 500 MG 24 hr tablet Commonly known as: GLUCOPHAGE-XR Take 1,000 mg by mouth 2 (two) times daily.   oxyCODONE 5 MG immediate release tablet Commonly known as: Oxy IR/ROXICODONE Take 1 tablet (5 mg total) by mouth every 4 (four) hours as needed for severe pain (pain score 7-10). For AFTER surgery only, do not take and drive   rizatriptan 10 MG tablet Commonly known as: MAXALT Take 10 mg by mouth as needed for migraine. May repeat in 2 hours if needed   rosuvastatin 10 MG tablet Commonly known as: CRESTOR Take 10 mg by mouth daily.   senna-docusate 8.6-50 MG tablet Commonly known as: Senokot-S Take 2 tablets by mouth at bedtime. For AFTER surgery, do not take if having diarrhea   Tresiba FlexTouch 100 UNIT/ML FlexTouch Pen Generic drug: insulin degludec Inject 48 Units into the skin daily.   Wixela Inhub 100-50 MCG/ACT Aepb Generic drug: fluticasone-salmeterol Inhale 1 puff into the lungs 2 (two) times daily.   zolpidem 10 MG tablet Commonly  known as: AMBIEN Take 10 mg by mouth at bedtime as needed for sleep.        Follow-up Information     Carver Fila, MD Follow up on 03/10/2023.   Specialty: Gynecologic Oncology Why: around 6 pm or earlier will be a phone visit with Dr. Pricilla Holm to check in and discuss path. IN PERSON visit will be on 04/03/23 at 3:45pm at the [facility] for post-op check. Contact information: [address] [place] [address] [phone]                 Greater than thirty minutes were spend for face to face  discharge instructions and discharge orders/summary in EPIC.   Signed: Doylene Bode 03/05/2023, 7:27 AM

## 2023-03-05 NOTE — Progress Notes (Signed)
   03/05/23 1033  TOC Brief Assessment  Insurance and Status Reviewed  Patient has primary care physician Yes  Home environment has been reviewed resides in private residence  Prior level of function: Independent  Prior/Current Home Services No current home services  Social Drivers of Health Review SDOH reviewed no interventions necessary  Readmission risk has been reviewed Yes  Transition of care needs no transition of care needs at this time

## 2023-03-06 ENCOUNTER — Telehealth: Payer: Self-pay | Admitting: *Deleted

## 2023-03-06 NOTE — Telephone Encounter (Signed)
Spoke with Ms. Klose this morning. She states she is eating, drinking and urinating well. She has had a BM and is passing gas. She is taking senokot as prescribed and encouraged her to drink plenty of water. She denies fever or chills. Incisions are dry and intact. She rates her pain 6/10. Her pain is controlled with tylenol. Pt states the only area of discomfort is the incision to the left upper abdomen by her rib cage. Pt is ambulating well and is following her activity restrictions. Pt denies vaginal bleeding and discharge.     Instructed to call office with any fever, chills, purulent drainage, uncontrolled pain or any other questions or concerns. Patient verbalizes understanding.   Pt aware of post op appointments as well as the office number [phone] and after hours number [phone] to call if she has any questions or concerns

## 2023-03-09 LAB — SURGICAL PATHOLOGY

## 2023-03-10 ENCOUNTER — Encounter: Payer: Self-pay | Admitting: Oncology

## 2023-03-10 ENCOUNTER — Inpatient Hospital Stay: Payer: Commercial Managed Care - HMO | Attending: Gynecologic Oncology | Admitting: Gynecologic Oncology

## 2023-03-10 ENCOUNTER — Encounter: Payer: Self-pay | Admitting: Gynecologic Oncology

## 2023-03-10 DIAGNOSIS — C541 Malignant neoplasm of endometrium: Secondary | ICD-10-CM

## 2023-03-10 DIAGNOSIS — Z1509 Genetic susceptibility to other malignant neoplasm: Secondary | ICD-10-CM

## 2023-03-10 DIAGNOSIS — Z7189 Other specified counseling: Secondary | ICD-10-CM

## 2023-03-10 NOTE — Progress Notes (Signed)
 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

## 2023-03-10 NOTE — Progress Notes (Signed)
 Gynecologic Oncology Telehealth Note: Gyn-Onc  I connected with Amanda Spence on 03/10/23 at  6:00 PM EST by telephone and verified that I am speaking with the correct person using two identifiers.  I discussed the limitations, risks, security and privacy concerns of performing an evaluation and management service by telemedicine and the availability of in-person appointments. I also discussed with the patient that there may be a patient responsible charge related to this service. The patient expressed understanding and agreed to proceed.  Other persons participating in the visit and their role in the encounter: none.  Patient's location: home Provider's location: [place]  Reason for Visit: follow-up  Treatment History: Oncology History  Endometrial cancer (HCC)  12/17/2022 Initial Diagnosis   Endometrial cancer (HCC)   12/17/2022 Surgery   Hysteroscopy with endometrial sampling using Myosure device, Tilt test, Mirena IUD insertion (Lot #[id number], exp 11/2024)    12/17/2022 Surgery   ENDOMETRIAL CURETTINGS:  Endometrioid adenocarcinoma, FIGO 1.  See comment.    03/04/2023 Surgery   Robotic-assisted laparoscopic total hysterectomy with bilateral salpingoophorectomy, SLN biopsy bilaterally, vaginal laceration repair    03/04/2023 Pathology Results   Stage IA2, grade 1 endometrioid No LVI, neg SLNs MMRd - loss of MLH1 and PMS2     Interval History: Doing well. LUQ incision is sore.  Voiding without difficulty, bowel function improving. Some vaginal spotting.  Past Medical/Surgical History: Past Medical History:  Diagnosis Date   Anxiety    Arthritis    osteoarthritis   Asthma    Depression    Situational   Eczema    Endometrial cancer (HCC)    Headache    hx of migraines   History of kidney stones    Hx of migraine headaches    Hypertension    Hypothyroidism    OSA (obstructive sleep apnea)    wears cpap   Type 2 diabetes mellitus (HCC)    Tresbia, metformin     Past Surgical History:  Procedure Laterality Date   DILATATION & CURETTAGE/HYSTEROSCOPY WITH MYOSURE N/A 12/17/2022   Procedure: DILATATION & CURETTAGE/HYSTEROSCOPY WITH MYOSURE;  Surgeon: Carver Fila, MD;  Location: [facility];  Service: Gynecology;  Laterality: N/A;   EXTRACORPOREAL SHOCK WAVE LITHOTRIPSY Left 01/17/2016   Procedure: EXTRACORPOREAL SHOCK WAVE LITHOTRIPSY (ESWL);  Surgeon: Alfredo Martinez, MD;  Location: [facility];  Service: Urology;  Laterality: Left;   FOOT SURGERY     mid foot fusion  and screw removal   HAND SURGERY     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   INTRAUTERINE DEVICE (IUD) INSERTION N/A 12/17/2022   Procedure: INTRAUTERINE DEVICE (IUD) INSERTION MIRENA, TILT TEST;  Surgeon: Carver Fila, MD;  Location: [facility];  Service: Gynecology;  Laterality: N/A;   LASIK Bilateral 2002   LITHOTRIPSY  2001   ROBOTIC ASSISTED TOTAL HYSTERECTOMY WITH BILATERAL SALPINGO OOPHERECTOMY Bilateral 03/04/2023   Procedure: XI ROBOTIC ASSISTED TOTAL HYSTERECTOMY WITH BILATERAL SALPINGO OOPHORECTOMY;  Surgeon: Carver Fila, MD;  Location: [facility];  Service: Gynecology;  Laterality: Bilateral;   SENTINEL NODE BIOPSY N/A 03/04/2023   Procedure: SENTINEL NODE BIOPSY;  Surgeon: Carver Fila, MD;  Location: [facility];  Service: Gynecology;  Laterality: N/A;   URETEROSCOPY WITH HOLMIUM LASER LITHOTRIPSY Right 2008 x2   WISDOM TOOTH EXTRACTION Bilateral 2002    Family History  Problem Relation Age of Onset   Lung cancer Father    COPD Sister    Endometrial cancer Sister    Lung cancer Paternal Aunt  Lung cancer Paternal Grandmother    Breast cancer Neg Hx    Ovarian cancer Neg Hx    Pancreatic cancer Neg Hx    Colon cancer Neg Hx    Prostate cancer Neg Hx     Social History   Socioeconomic History   Marital status: Single    Spouse name: Not on file   Number of children: Not on file   Years of education: Not on file   Highest education level: Not on file   Occupational History   Not on file  Tobacco Use   Smoking status: Never    Passive exposure: Never   Smokeless tobacco: Never  Vaping Use   Vaping status: Never Used  Substance and Sexual Activity   Alcohol use: Not Currently    Comment: 2-3 drinks per week   Drug use: No   Sexual activity: Not Currently    Birth control/protection: Post-menopausal  Other Topics Concern   Not on file  Social History Narrative   Not on file   Social Drivers of Health   Financial Resource Strain: Not on file  Food Insecurity: No Food Insecurity (03/04/2023)   Hunger Vital Sign    Worried About Running Out of Food in the Last Year: Never true    Ran Out of Food in the Last Year: Never true  Transportation Needs: No Transportation Needs (03/04/2023)   PRAPARE - Administrator, Civil Service (Medical): No    Lack of Transportation (Non-Medical): No  Physical Activity: Not on file  Stress: Not on file  Social Connections: Socially Isolated (03/04/2023)   Social Connection and Isolation Panel [NHANES]    Frequency of Communication with Friends and Family: More than three times a week    Frequency of Social Gatherings with Friends and Family: Twice a week    Attends Religious Services: Never    Database administrator or Organizations: No    Attends Engineer, structural: Never    Marital Status: Never married    Current Medications:  Current Outpatient Medications:    albuterol (PROVENTIL HFA;VENTOLIN HFA) 108 (90 Base) MCG/ACT inhaler, Inhale 2 puffs into the lungs every 4 (four) hours as needed for wheezing or shortness of breath., Disp: , Rfl:    busPIRone (BUSPAR) 7.5 MG tablet, Take 7.5 mg by mouth 2 (two) times daily., Disp: , Rfl:    Cholecalciferol 50 MCG (2000 UT) CAPS, Take 2,000 Units by mouth daily., Disp: , Rfl:    Cinnamon 500 MG capsule, Take 500 mg by mouth daily., Disp: , Rfl:    EPINEPHrine 0.3 mg/0.3 mL IJ SOAJ injection, Inject 0.3 mg into the muscle as  needed for anaphylaxis., Disp: , Rfl:    escitalopram (LEXAPRO) 20 MG tablet, Take 20 mg by mouth daily., Disp: , Rfl:    fexofenadine-pseudoephedrine (ALLEGRA-D ALLERGY & CONGESTION) 180-240 MG 24 hr tablet, Take 1 tablet by mouth daily as needed (allergies/congestion)., Disp: , Rfl:    fluticasone (FLONASE ALLERGY RELIEF) 50 MCG/ACT nasal spray, Place 2 sprays into both nostrils daily as needed for allergies., Disp: , Rfl:    fluticasone-salmeterol (WIXELA INHUB) 100-50 MCG/ACT AEPB, Inhale 1 puff into the lungs 2 (two) times daily., Disp: , Rfl:    levothyroxine (SYNTHROID) 175 MCG tablet, Take 175 mcg by mouth daily before breakfast., Disp: , Rfl:    lisinopril (ZESTRIL) 10 MG tablet, Take 10 mg by mouth daily., Disp: , Rfl:    metFORMIN (GLUCOPHAGE-XR) 500 MG 24  hr tablet, Take 1,000 mg by mouth 2 (two) times daily., Disp: , Rfl:    oxyCODONE (OXY IR/ROXICODONE) 5 MG immediate release tablet, Take 1 tablet (5 mg total) by mouth every 4 (four) hours as needed for severe pain (pain score 7-10). For AFTER surgery only, do not take and drive, Disp: 10 tablet, Rfl: 0   rizatriptan (MAXALT) 10 MG tablet, Take 10 mg by mouth as needed for migraine. May repeat in 2 hours if needed, Disp: , Rfl:    rosuvastatin (CRESTOR) 10 MG tablet, Take 10 mg by mouth daily., Disp: , Rfl:    senna-docusate (SENOKOT-S) 8.6-50 MG tablet, Take 2 tablets by mouth at bedtime. For AFTER surgery, do not take if having diarrhea, Disp: 30 tablet, Rfl: 0   TRESIBA FLEXTOUCH 100 UNIT/ML FlexTouch Pen, Inject 48 Units into the skin daily., Disp: , Rfl:    zolpidem (AMBIEN) 10 MG tablet, Take 10 mg by mouth at bedtime as needed for sleep., Disp: , Rfl:   Review of Symptoms: Pertinent positives as per HPI.  Physical Exam: Deferred given limitations of phone visit.  Laboratory & Radiologic Studies: See treatment history  Assessment & Plan: Amanda Spence is a 53 y.o. 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  Doing well. Discussed continued expectations and restrictions. Reviewed pathology from surgery. Very happy with this news.   Reviewed MMR results - plan for MSI and MLH1 promoter hypermethylation.  I discussed the assessment and treatment plan with the patient. The patient was provided with an opportunity to ask questions and all were answered. The patient agreed with the plan and demonstrated an understanding of the instructions.   The patient was advised to call back or see an in-person evaluation if the symptoms worsen or if the condition fails to improve as anticipated.   8 minutes of total time was spent for this patient encounter, including preparation, phone counseling with the patient and coordination of care, and documentation of the encounter.   Eugene Garnet, MD  Division of Gynecologic Oncology  Department of Obstetrics and Gynecology  [facility]

## 2023-03-25 LAB — MOLECULAR PATHOLOGY

## 2023-04-03 ENCOUNTER — Inpatient Hospital Stay: Payer: Commercial Managed Care - PPO | Attending: Gynecologic Oncology | Admitting: Gynecologic Oncology

## 2023-04-03 ENCOUNTER — Encounter: Payer: Self-pay | Admitting: Gynecologic Oncology

## 2023-04-03 VITALS — BP 132/73 | HR 100 | Temp 98.6°F | Resp 20 | Wt 269.6 lb

## 2023-04-03 DIAGNOSIS — Z90710 Acquired absence of both cervix and uterus: Secondary | ICD-10-CM

## 2023-04-03 DIAGNOSIS — Z7189 Other specified counseling: Secondary | ICD-10-CM

## 2023-04-03 DIAGNOSIS — C541 Malignant neoplasm of endometrium: Secondary | ICD-10-CM

## 2023-04-03 DIAGNOSIS — Z9079 Acquired absence of other genital organ(s): Secondary | ICD-10-CM

## 2023-04-03 DIAGNOSIS — Z90722 Acquired absence of ovaries, bilateral: Secondary | ICD-10-CM

## 2023-04-03 NOTE — Patient Instructions (Signed)
 It was good to see you today.  You are healing very well from surgery!  Please remember, no heavy lifting until 6 weeks after surgery and nothing in the vagina for at least 12 weeks.  I will see you for follow-up in 6 months.  As always, if you develop any new and concerning symptoms before your next visit, please call to see me sooner.  Times that should prompt a phone call include vaginal bleeding, pelvic pain, change to bowel or bladder function, unintentional weight loss.

## 2023-04-03 NOTE — Progress Notes (Signed)
 Gynecologic Oncology Return Clinic Visit  04/03/23  Reason for Visit: follow-up, treatment planning  Treatment History: Oncology History  Endometrial cancer (HCC)  12/17/2022 Initial Diagnosis   Endometrial cancer (HCC)   12/17/2022 Surgery   Hysteroscopy with endometrial sampling using Myosure device, Tilt test, Mirena IUD insertion (Lot #[id number], exp 11/2024)    12/17/2022 Surgery   ENDOMETRIAL CURETTINGS:  Endometrioid adenocarcinoma, FIGO 1.  See comment.    03/04/2023 Surgery   Robotic-assisted laparoscopic total hysterectomy with bilateral salpingoophorectomy, SLN biopsy bilaterally, vaginal laceration repair    03/04/2023 Pathology Results   Stage IA2, grade 1 endometrioid No LVI, neg SLNs MMRd - loss of MLH1 and PMS2     Interval History: Doing well.  Denies any vaginal bleeding.  Reports normal bowel and bladder function.  Still some fatigue.  Endorses some hot flashes.  Past Medical/Surgical History: Past Medical History:  Diagnosis Date   Anxiety    Arthritis    osteoarthritis   Asthma    Depression    Situational   Eczema    Endometrial cancer (HCC)    Headache    hx of migraines   History of kidney stones    Hx of migraine headaches    Hypertension    Hypothyroidism    OSA (obstructive sleep apnea)    wears cpap   Type 2 diabetes mellitus (HCC)    Tresbia, metformin    Past Surgical History:  Procedure Laterality Date   DILATATION & CURETTAGE/HYSTEROSCOPY WITH MYOSURE N/A 12/17/2022   Procedure: DILATATION & CURETTAGE/HYSTEROSCOPY WITH MYOSURE;  Surgeon: Carver Fila, MD;  Location: [facility];  Service: Gynecology;  Laterality: N/A;   EXTRACORPOREAL SHOCK WAVE LITHOTRIPSY Left 01/17/2016   Procedure: EXTRACORPOREAL SHOCK WAVE LITHOTRIPSY (ESWL);  Surgeon: Alfredo Martinez, MD;  Location: [facility];  Service: Urology;  Laterality: Left;   FOOT SURGERY     mid foot fusion  and screw removal   HAND SURGERY     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    INTRAUTERINE DEVICE (IUD) INSERTION N/A 12/17/2022   Procedure: INTRAUTERINE DEVICE (IUD) INSERTION MIRENA, TILT TEST;  Surgeon: Carver Fila, MD;  Location: [facility];  Service: Gynecology;  Laterality: N/A;   LASIK Bilateral 2002   LITHOTRIPSY  2001   ROBOTIC ASSISTED TOTAL HYSTERECTOMY WITH BILATERAL SALPINGO OOPHERECTOMY Bilateral 03/04/2023   Procedure: XI ROBOTIC ASSISTED TOTAL HYSTERECTOMY WITH BILATERAL SALPINGO OOPHORECTOMY;  Surgeon: Carver Fila, MD;  Location: [facility];  Service: Gynecology;  Laterality: Bilateral;   SENTINEL NODE BIOPSY N/A 03/04/2023   Procedure: SENTINEL NODE BIOPSY;  Surgeon: Carver Fila, MD;  Location: [facility];  Service: Gynecology;  Laterality: N/A;   URETEROSCOPY WITH HOLMIUM LASER LITHOTRIPSY Right 2008 x2   WISDOM TOOTH EXTRACTION Bilateral 2002    Family History  Problem Relation Age of Onset   Lung cancer Father    COPD Sister    Endometrial cancer Sister    Lung cancer Paternal Aunt    Lung cancer Paternal Grandmother    Breast cancer Neg Hx    Ovarian cancer Neg Hx    Pancreatic cancer Neg Hx    Colon cancer Neg Hx    Prostate cancer Neg Hx     Social History   Socioeconomic History   Marital status: Single    Spouse name: Not on file   Number of children: Not on file   Years of education: Not on file   Highest education level: Not on file  Occupational History  Not on file  Tobacco Use   Smoking status: Never    Passive exposure: Never   Smokeless tobacco: Never  Vaping Use   Vaping status: Never Used  Substance and Sexual Activity   Alcohol use: Not Currently    Comment: 2-3 drinks per week   Drug use: No   Sexual activity: Not Currently    Birth control/protection: Post-menopausal  Other Topics Concern   Not on file  Social History Narrative   Not on file   Social Drivers of Health   Financial Resource Strain: Not on file  Food Insecurity: No Food Insecurity (03/04/2023)   Hunger Vital Sign    Worried  About Running Out of Food in the Last Year: Never true    Ran Out of Food in the Last Year: Never true  Transportation Needs: No Transportation Needs (03/04/2023)   PRAPARE - Administrator, Civil Service (Medical): No    Lack of Transportation (Non-Medical): No  Physical Activity: Not on file  Stress: Not on file  Social Connections: Socially Isolated (03/04/2023)   Social Connection and Isolation Panel [NHANES]    Frequency of Communication with Friends and Family: More than three times a week    Frequency of Social Gatherings with Friends and Family: Twice a week    Attends Religious Services: Never    Database administrator or Organizations: No    Attends Engineer, structural: Never    Marital Status: Never married    Current Medications:  Current Outpatient Medications:    albuterol (PROVENTIL HFA;VENTOLIN HFA) 108 (90 Base) MCG/ACT inhaler, Inhale 2 puffs into the lungs every 4 (four) hours as needed for wheezing or shortness of breath., Disp: , Rfl:    busPIRone (BUSPAR) 7.5 MG tablet, Take 7.5 mg by mouth 2 (two) times daily., Disp: , Rfl:    Cholecalciferol 50 MCG (2000 UT) CAPS, Take 2,000 Units by mouth daily., Disp: , Rfl:    Cinnamon 500 MG capsule, Take 500 mg by mouth daily., Disp: , Rfl:    EPINEPHrine 0.3 mg/0.3 mL IJ SOAJ injection, Inject 0.3 mg into the muscle as needed for anaphylaxis., Disp: , Rfl:    escitalopram (LEXAPRO) 20 MG tablet, Take 20 mg by mouth daily., Disp: , Rfl:    fexofenadine-pseudoephedrine (ALLEGRA-D ALLERGY & CONGESTION) 180-240 MG 24 hr tablet, Take 1 tablet by mouth daily as needed (allergies/congestion)., Disp: , Rfl:    fluticasone (FLONASE ALLERGY RELIEF) 50 MCG/ACT nasal spray, Place 2 sprays into both nostrils daily as needed for allergies., Disp: , Rfl:    fluticasone-salmeterol (WIXELA INHUB) 100-50 MCG/ACT AEPB, Inhale 1 puff into the lungs 2 (two) times daily., Disp: , Rfl:    levothyroxine (SYNTHROID) 175 MCG  tablet, Take 175 mcg by mouth daily before breakfast., Disp: , Rfl:    lisinopril (ZESTRIL) 10 MG tablet, Take 10 mg by mouth daily., Disp: , Rfl:    metFORMIN (GLUCOPHAGE-XR) 500 MG 24 hr tablet, Take 1,000 mg by mouth 2 (two) times daily., Disp: , Rfl:    rizatriptan (MAXALT) 10 MG tablet, Take 10 mg by mouth as needed for migraine. May repeat in 2 hours if needed, Disp: , Rfl:    rosuvastatin (CRESTOR) 10 MG tablet, Take 10 mg by mouth daily., Disp: , Rfl:    TRESIBA FLEXTOUCH 100 UNIT/ML FlexTouch Pen, Inject 48 Units into the skin daily., Disp: , Rfl:    zolpidem (AMBIEN) 10 MG tablet, Take 10 mg by mouth at  bedtime as needed for sleep., Disp: , Rfl:   Review of Systems: Denies appetite changes, fevers, chills, fatigue, unexplained weight changes. Denies hearing loss, neck lumps or masses, mouth sores, ringing in ears or voice changes. Denies cough or wheezing.  Denies shortness of breath. Denies chest pain or palpitations. Denies leg swelling. Denies abdominal distention, pain, blood in stools, constipation, diarrhea, nausea, vomiting, or early satiety. Denies pain with intercourse, dysuria, frequency, hematuria or incontinence. Denies pelvic pain, vaginal bleeding or vaginal discharge.   Denies joint pain, back pain or muscle pain/cramps. Denies itching, rash, or wounds. Denies dizziness, headaches, numbness or seizures. Denies swollen lymph nodes or glands, denies easy bruising or bleeding. Denies anxiety, depression, confusion, or decreased concentration.  Physical Exam: BP 132/73 (BP Location: Left Arm, Patient Position: Sitting)   Pulse 100   Temp 98.6 F (37 C) (Oral)   Resp 20   Wt 269 lb 9.6 oz (122.3 kg)   LMP 12/06/2022   SpO2 100%   BMI 49.31 kg/m  General: Alert, oriented, no acute distress. HEENT: Posterior oropharynx clear, sclera anicteric. Chest: Unlabored breathing on room air. Abdomen: Obese, soft, nontender.  Normoactive bowel sounds.  No masses or  hepatosplenomegaly appreciated.  Well-healed incisions. Extremities: Grossly normal range of motion.  Warm, well perfused.  No edema bilaterally. GU: Normal appearing external genitalia without erythema, excoriation, or lesions.  Speculum exam reveals suture visible along posterior vagina.  Overall some discomfort with the speculum exam.  Cuff challenging to see but appears intact.  Bimanual exam reveals cuff intact, no tenderness along the cuff or fluctuance with palpation.    Laboratory & Radiologic Studies: None new  Assessment & Plan: Amanda Spence is a 53 y.o. 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 MMRd, MSI-H, MLH1 promoter hypermethylation present. P53 WT. ER+.  Patient is overall doing well, meeting postoperative milestones.  Discussed continued expectations and restrictions.  We reviewed her pathology from surgery.  She was given a copy of her pathology report.  Given early stage, low risk disease, no adjuvant therapy indicated.  Per NCCN surveillance recommendations, we discussed follow-up visits every 6 months.  We will alternate these between my office and her OB/GYN.  I will see her for her first visit in 6 months.  We discussed signs and symptoms that would be concerning for cancer recurrence, and I stressed the importance of calling if she develops any of these between visits.  18 minutes of total time was spent for this patient encounter, including preparation, face-to-face counseling with the patient and coordination of care, and documentation of the encounter.  Eugene Garnet, MD  Division of Gynecologic Oncology  Department of Obstetrics and Gynecology  [facility]

## 2023-05-18 ENCOUNTER — Telehealth: Payer: Self-pay | Admitting: *Deleted

## 2023-05-18 NOTE — Telephone Encounter (Signed)
 LMOM for the patient to call the office back. Patient needs to have appt on 9/26 moved to 9/5 or in October.

## 2023-05-20 NOTE — Telephone Encounter (Signed)
 Spoke with the patient and canceled appt for 9/26. Patient to see regular gyn in September and will call our office in December for an appt in February.

## 2023-09-01 ENCOUNTER — Other Ambulatory Visit: Payer: Self-pay

## 2023-09-01 MED ORDER — METFORMIN HCL ER 500 MG PO TB24
1000.0000 mg | ORAL_TABLET | Freq: Two times a day (BID) | ORAL | 0 refills | Status: AC
  Filled 2023-09-01: qty 360, 90d supply, fill #0

## 2023-09-01 MED ORDER — ROSUVASTATIN CALCIUM 10 MG PO TABS
10.0000 mg | ORAL_TABLET | Freq: Every day | ORAL | 0 refills | Status: AC
  Filled 2023-09-01: qty 90, 90d supply, fill #0

## 2023-09-01 MED ORDER — ONDANSETRON HCL 4 MG PO TABS
4.0000 mg | ORAL_TABLET | Freq: Every day | ORAL | 0 refills | Status: AC | PRN
  Filled 2023-09-01: qty 45, 22d supply, fill #0

## 2023-09-01 MED ORDER — ALBUTEROL SULFATE HFA 108 (90 BASE) MCG/ACT IN AERS
1.0000 | INHALATION_SPRAY | RESPIRATORY_TRACT | 0 refills | Status: AC | PRN
  Filled 2023-09-01: qty 18, 34d supply, fill #0

## 2023-09-01 MED ORDER — FREESTYLE LIBRE 2 SENSOR MISC
5 refills | Status: AC
  Filled 2023-09-01: qty 2, 28d supply, fill #0
  Filled 2023-10-28: qty 2, 28d supply, fill #1

## 2023-09-01 MED ORDER — ESCITALOPRAM OXALATE 20 MG PO TABS
20.0000 mg | ORAL_TABLET | Freq: Every day | ORAL | 1 refills | Status: DC
  Filled 2023-09-01: qty 90, 90d supply, fill #0
  Filled 2023-12-18: qty 90, 90d supply, fill #1

## 2023-09-01 MED ORDER — LISINOPRIL 10 MG PO TABS
5.0000 mg | ORAL_TABLET | Freq: Every day | ORAL | 1 refills | Status: AC
  Filled 2023-09-01: qty 45, 90d supply, fill #0

## 2023-09-01 MED ORDER — TRESIBA FLEXTOUCH 100 UNIT/ML ~~LOC~~ SOPN
48.0000 [IU] | PEN_INJECTOR | Freq: Every day | SUBCUTANEOUS | 0 refills | Status: AC
  Filled 2023-09-01: qty 15, 30d supply, fill #0
  Filled 2023-10-28: qty 15, 30d supply, fill #1

## 2023-09-01 MED ORDER — BUSPIRONE HCL 7.5 MG PO TABS
7.5000 mg | ORAL_TABLET | Freq: Two times a day (BID) | ORAL | 3 refills | Status: AC
  Filled 2023-09-01: qty 180, 90d supply, fill #0

## 2023-09-02 ENCOUNTER — Other Ambulatory Visit (HOSPITAL_BASED_OUTPATIENT_CLINIC_OR_DEPARTMENT_OTHER): Payer: Self-pay

## 2023-09-08 ENCOUNTER — Other Ambulatory Visit: Payer: Self-pay

## 2023-09-09 ENCOUNTER — Other Ambulatory Visit: Payer: Self-pay

## 2023-09-11 ENCOUNTER — Other Ambulatory Visit: Payer: Self-pay

## 2023-09-11 DIAGNOSIS — E039 Hypothyroidism, unspecified: Secondary | ICD-10-CM | POA: Diagnosis not present

## 2023-09-23 ENCOUNTER — Other Ambulatory Visit: Payer: Self-pay

## 2023-09-23 MED ORDER — LEVOTHYROXINE SODIUM 175 MCG PO TABS
175.0000 ug | ORAL_TABLET | Freq: Every day | ORAL | 1 refills | Status: AC
  Filled 2023-09-23: qty 30, 30d supply, fill #0
  Filled 2023-10-28: qty 30, 30d supply, fill #1

## 2023-09-25 ENCOUNTER — Other Ambulatory Visit: Payer: Self-pay

## 2023-10-09 ENCOUNTER — Ambulatory Visit: Admitting: Gynecologic Oncology

## 2023-10-12 DIAGNOSIS — Z01419 Encounter for gynecological examination (general) (routine) without abnormal findings: Secondary | ICD-10-CM | POA: Diagnosis not present

## 2023-10-12 DIAGNOSIS — Z8542 Personal history of malignant neoplasm of other parts of uterus: Secondary | ICD-10-CM | POA: Diagnosis not present

## 2023-10-12 DIAGNOSIS — Z90710 Acquired absence of both cervix and uterus: Secondary | ICD-10-CM | POA: Diagnosis not present

## 2023-10-19 DIAGNOSIS — G4733 Obstructive sleep apnea (adult) (pediatric): Secondary | ICD-10-CM | POA: Diagnosis not present

## 2023-10-19 DIAGNOSIS — I10 Essential (primary) hypertension: Secondary | ICD-10-CM | POA: Diagnosis not present

## 2023-10-19 DIAGNOSIS — Z0000 Encounter for general adult medical examination without abnormal findings: Secondary | ICD-10-CM | POA: Diagnosis not present

## 2023-10-19 DIAGNOSIS — Z8639 Personal history of other endocrine, nutritional and metabolic disease: Secondary | ICD-10-CM | POA: Diagnosis not present

## 2023-10-19 DIAGNOSIS — M2480 Other specific joint derangements of unspecified joint, not elsewhere classified: Secondary | ICD-10-CM | POA: Diagnosis not present

## 2023-10-19 DIAGNOSIS — F419 Anxiety disorder, unspecified: Secondary | ICD-10-CM | POA: Diagnosis not present

## 2023-10-19 DIAGNOSIS — E039 Hypothyroidism, unspecified: Secondary | ICD-10-CM | POA: Diagnosis not present

## 2023-10-19 DIAGNOSIS — J45909 Unspecified asthma, uncomplicated: Secondary | ICD-10-CM | POA: Diagnosis not present

## 2023-10-19 DIAGNOSIS — E782 Mixed hyperlipidemia: Secondary | ICD-10-CM | POA: Diagnosis not present

## 2023-10-19 DIAGNOSIS — E1165 Type 2 diabetes mellitus with hyperglycemia: Secondary | ICD-10-CM | POA: Diagnosis not present

## 2023-10-28 ENCOUNTER — Other Ambulatory Visit: Payer: Self-pay

## 2023-11-06 ENCOUNTER — Other Ambulatory Visit: Payer: Self-pay

## 2023-11-09 NOTE — Patient Instructions (Incomplete)
 1. Allergic reaction - unknown trigger - Testing on 08/05/22 was slightly reactive to sesame, casein (component of cows milk), and pineapple. - However, none of these was particularly large. - I would avoid sesame, cows milk, and pineapple for now. - Stinging insect panel with low positive to fire  ant and honey bee. Avoid stinging insects and have access to epinephrine auto injector device - tryptase normal (3.2) and alpha gal normal - Have access to EpiPen at all times. - Follow anaphylaxis management  3. Moderate persistent asthma, uncomplicated - Daily controller medication(s): Advair 250/35mcg one puff twice daily - Prior to physical activity: albuterol  2 puffs 10-15 minutes before physical activity. - Rescue medications: albuterol  4 puffs every 4-6 hours as needed - Asthma control goals:  * Full participation in all desired activities (may need albuterol  before activity) * Albuterol  use two time or less a week on average (not counting use with activity) * Cough interfering with sleep two time or less a month * Oral steroids no more than once a year * No hospitalizations  4. Seasonal and perennial allergic rhinitis - Testing on 08/05/22 showed: grasses, trees, indoor molds, cat, dog, cockroach, and horse - Continue voidance measures as below - Continue: Xyzal (levocetirizine) 5mg  tablet once daily and Flonase (fluticasone) one spray per nostril daily (AIM FOR EAR ON EACH SIDE) - You can use an extra dose of the antihistamine, if needed, for breakthrough symptoms.  - Consider nasal saline rinses 1-2 times daily to remove allergens from the nasal cavities as well as help with mucous clearance (this is especially helpful to do before the nasal sprays are given) - Consider allergy  shots as a means of long-term control. - Allergy  shots re-train and reset the immune system to ignore environmental allergens and decrease the resulting immune response to those allergens (sneezing, itchy  watery eyes, runny nose, nasal congestion, etc).    - Allergy  shots improve symptoms in 75-85% of patients.  - We can discuss more at the next appointment if the medications are not working for you.  5. Follow up in months or sooner if needed    Please inform us  of any Emergency Department visits, hospitalizations, or changes in symptoms. Call us  before going to the ED for breathing or allergy  symptoms since we might be able to fit you in for a sick visit. Feel free to contact us  anytime with any questions, problems, or concerns.  It was a pleasure to meet you today!  Websites that have reliable patient information: 1. American Academy of Asthma, Allergy , and Immunology: www.aaaai.org 2. Food Allergy  Research and Education (FARE): foodallergy.org 3. Mothers of Asthmatics: http://www.asthmacommunitynetwork.org 4. American College of Allergy , Asthma, and Immunology: www.acaai.org       Reducing Pollen Exposure  The American Academy of Allergy , Asthma and Immunology suggests the following steps to reduce your exposure to pollen during allergy  seasons.    Do not hang sheets or clothing out to dry; pollen may collect on these items. Do not mow lawns or spend time around freshly cut grass; mowing stirs up pollen. Keep windows closed at night.  Keep car windows closed while driving. Minimize morning activities outdoors, a time when pollen counts are usually at their highest. Stay indoors as much as possible when pollen counts or humidity is high and on windy days when pollen tends to remain in the air longer. Use air conditioning when possible.  Many air conditioners have filters that trap the pollen spores. Use a HEPA room air filter  to remove pollen form the indoor air you breathe.  Control of Mold Allergen   Mold and fungi can grow on a variety of surfaces provided certain temperature and moisture conditions exist.  Outdoor molds grow on plants, decaying vegetation and soil.  The  major outdoor mold, Alternaria and Cladosporium, are found in very high numbers during hot and dry conditions.  Generally, a late Summer - Fall peak is seen for common outdoor fungal spores.  Rain will temporarily lower outdoor mold spore count, but counts rise rapidly when the rainy period ends.  The most important indoor molds are Aspergillus and Penicillium.  Dark, humid and poorly ventilated basements are ideal sites for mold growth.  The next most common sites of mold growth are the bathroom and the kitchen.  Indoor (Perennial) Mold Control   Positive indoor molds via skin testing: Fusarium  Maintain humidity below 50%. Clean washable surfaces with 5% bleach solution. Remove sources e.g. contaminated carpets.   Control of Dog or Cat Allergen  Avoidance is the best way to manage a dog or cat allergy . If you have a dog or cat and are allergic to dog or cats, consider removing the dog or cat from the home. If you have a dog or cat but don't want to find it a new home, or if your family wants a pet even though someone in the household is allergic, here are some strategies that may help keep symptoms at bay:  Keep the pet out of your bedroom and restrict it to only a few rooms. Be advised that keeping the dog or cat in only one room will not limit the allergens to that room. Don't pet, hug or kiss the dog or cat; if you do, wash your hands with soap and water . High-efficiency particulate air (HEPA) cleaners run continuously in a bedroom or living room can reduce allergen levels over time. Regular use of a high-efficiency vacuum cleaner or a central vacuum can reduce allergen levels. Giving your dog or cat a bath at least once a week can reduce airborne allergen.  Control of Cockroach Allergen  Cockroach allergen has been identified as an important cause of acute attacks of asthma, especially in urban settings.  There are fifty-five species of cockroach that exist in the [place] , however  only three, the American, German and Oriental species produce allergen that can affect patients with Asthma.  Allergens can be obtained from fecal particles, egg casings and secretions from cockroaches.    Remove food sources. Reduce access to water . Seal access and entry points. Spray runways with 0.5-1% Diazinon or Chlorpyrifos Blow boric acid power under stoves and refrigerator. Place bait stations (hydramethylnon) at feeding sites.  Allergy  Shots  Allergies are the result of a chain reaction that starts in the immune system. Your immune system controls how your body defends itself. For instance, if you have an allergy  to pollen, your immune system identifies pollen as an invader or allergen. Your immune system overreacts by producing antibodies called Immunoglobulin E (IgE). These antibodies travel to cells that release chemicals, causing an allergic reaction.  The concept behind allergy  immunotherapy, whether it is received in the form of shots or tablets, is that the immune system can be desensitized to specific allergens that trigger allergy  symptoms. Although it requires time and patience, the payback can be long-term relief. Allergy  injections contain a dilute solution of those substances that you are allergic to based upon your skin testing and allergy  history.   How  Do Allergy  Shots Work?  Allergy  shots work much like a vaccine. Your body responds to injected amounts of a particular allergen given in increasing doses, eventually developing a resistance and tolerance to it. Allergy  shots can lead to decreased, minimal or no allergy  symptoms.  There generally are two phases: build-up and maintenance. Build-up often ranges from three to six months and involves receiving injections with increasing amounts of the allergens. The shots are typically given once or twice a week, though more rapid build-up schedules are sometimes used.  The maintenance phase begins when the most effective dose  is reached. This dose is different for each person, depending on how allergic you are and your response to the build-up injections. Once the maintenance dose is reached, there are longer periods between injections, typically two to four weeks.  Occasionally doctors give cortisone-type shots that can temporarily reduce allergy  symptoms. These types of shots are different and should not be confused with allergy  immunotherapy shots.  Who Can Be Treated with Allergy  Shots?  Allergy  shots may be a good treatment approach for people with allergic rhinitis (hay fever), allergic asthma, conjunctivitis (eye allergy ) or stinging insect allergy .   Before deciding to begin allergy  shots, you should consider:   The length of allergy  season and the severity of your symptoms  Whether medications and/or changes to your environment can control your symptoms  Your desire to avoid long-term medication use  Time: allergy  immunotherapy requires a major time commitment  Cost: may vary depending on your insurance coverage  Allergy  shots for children age 6 and older are effective and often well tolerated. They might prevent the onset of new allergen sensitivities or the progression to asthma.  Allergy  shots are not started on patients who are pregnant but can be continued on patients who become pregnant while receiving them. In some patients with other medical conditions or who take certain common medications, allergy  shots may be of risk. It is important to mention other medications you talk to your allergist.   What are the two types of build-ups offered:   RUSH or Rapid Desensitization -- one day of injections lasting from 8:30-4:30pm, injections every 1 hour.  Approximately half of the build-up process is completed in that one day.  The following week, normal build-up is resumed, and this entails ~16 visits either weekly or twice weekly, until reaching your "maintenance dose" which is continued weekly until  eventually getting spaced out to every month for a duration of 3 to 5 years. The regular build-up appointments are nurse visits where the injections are administered, followed by required monitoring for 30 minutes.    Traditional build-up -- weekly visits for 6 -12 months until reaching "maintenance dose", then continue weekly until eventually spacing out to every 4 weeks as above. At these appointments, the injections are administered, followed by required monitoring for 30 minutes.     Either way is acceptable, and both are equally effective. With the rush protocol, the advantage is that less time is spent here for injections overall AND you would also reach maintenance dosing faster (which is when the clinical benefit starts to become more apparent). Not everyone is a candidate for rapid desensitization.   IF we proceed with the RUSH protocol, there are premedications which must be taken the day before and the day after the rush only (this includes antihistamines, steroids, and Singulair).  After the rush day, no prednisone or Singulair is required, and we just recommend antihistamines taken on your injection  day.  What Is An Estimate of the Costs?  If you are interested in starting allergy  injections, please check with your insurance company about your coverage for both allergy  vial sets and allergy  injections.  Please do so prior to making the appointment to start injections.  The following are CPT codes to give to your insurance company. These are the amounts we BILL to the insurance company, but the amount YOU WILL PAY and WE RECEIVE IS SUBSTANTIALLY LESS and depends on the contracts we have with different insurance companies.   Amount Billed to Insurance One allergy  vial set  CPT 95165   $ 1200     Two allergy  vial set  CPT 95165   $ 2400     Three allergy  vial set  CPT 95165   $ 3600     One injection   CPT 95115   $ 35  Two injections   CPT 95117   $ 40 RUSH (Rapid Desensitization) CPT  95180 x 8 hours $500/hour  Regarding the allergy  injections, your co-pay may or may not apply with each injection, so please confirm this with your insurance company. When you start allergy  injections, 1 or 2 sets of vials are made based on your allergies.  Not all patients can be on one set of vials. A set of vials lasts 6 months to a year depending on how quickly you can proceed with your build-up of your allergy  injections. Vials are personalized for each patient depending on their specific allergens.  How often are allergy  injection given during the build-up period?   Injections are given at least weekly during the build-up period until your maintenance dose is achieved. Per the doctor's discretion, you may have the option of getting allergy  injections two times per week during the build-up period. However, there must be at least 48 hours between injections. The build-up period is usually completed within 6-12 months depending on your ability to schedule injections and for adjustments for reactions. When maintenance dose is reached, your injection schedule is gradually changed to every two weeks and later to every three weeks. Injections will then continue every 4 weeks. Usually, injections are continued for a total of 3-5 years.   When Will I Feel Better?  Some may experience decreased allergy  symptoms during the build-up phase. For others, it may take as long as 12 months on the maintenance dose. If there is no improvement after a year of maintenance, your allergist will discuss other treatment options with you.  If you aren't responding to allergy  shots, it may be because there is not enough dose of the allergen in your vaccine or there are missing allergens that were not identified during your allergy  testing. Other reasons could be that there are high levels of the allergen in your environment or major exposure to non-allergic triggers like tobacco smoke.  What Is the Length of  Treatment?  Once the maintenance dose is reached, allergy  shots are generally continued for three to five years. The decision to stop should be discussed with your allergist at that time. Some people may experience a permanent reduction of allergy  symptoms. Others may relapse and a longer course of allergy  shots can be considered.  What Are the Possible Reactions?  The two types of adverse reactions that can occur with allergy  shots are local and systemic. Common local reactions include very mild redness and swelling at the injection site, which can happen immediately or several hours after. Report a delayed reaction from  your last injection. These include arm swelling or runny nose, watery eyes or cough that occurs within 12-24 hours after injection. A systemic reaction, which is less common, affects the entire body or a particular body system. They are usually mild and typically respond quickly to medications. Signs include increased allergy  symptoms such as sneezing, a stuffy nose or hives.   Rarely, a serious systemic reaction called anaphylaxis can develop. Symptoms include swelling in the throat, wheezing, a feeling of tightness in the chest, nausea or dizziness. Most serious systemic reactions develop within 30 minutes of allergy  shots. This is why it is strongly recommended you wait in your doctor's office for 30 minutes after your injections. Your allergist is trained to watch for reactions, and his or her staff is trained and equipped with the proper medications to identify and treat them.   Report to the nurse immediately if you experience any of the following symptoms: swelling, itching or redness of the skin, hives, watery eyes/nose, breathing difficulty, excessive sneezing, coughing, stomach pain, diarrhea, or light headedness. These symptoms may occur within 15-20 minutes after injection and may require medication.   Who Should Administer Allergy  Shots?  The preferred location for  receiving shots is your prescribing allergist's office. Injections can sometimes be given at another facility where the physician and staff are trained to recognize and treat reactions, and have received instructions by your prescribing allergist.  What if I am late for an injection?   Injection dose will be adjusted depending upon how many days or weeks you are late for your injection.   What if I am sick?   Please report any illness to the nurse before receiving injections. She may adjust your dose or postpone injections depending on your symptoms. If you have fever, flu, sinus infection or chest congestion it is best to postpone allergy  injections until you are better. Never get an allergy  injection if your asthma is causing you problems. If your symptoms persist, seek out medical care to get your health problem under control.  What If I am or Become Pregnant:  Women that become pregnant should schedule an appointment with [facility] before receiving any further allergy  injections.

## 2023-11-10 ENCOUNTER — Encounter: Payer: Self-pay | Admitting: Family

## 2023-11-10 ENCOUNTER — Ambulatory Visit: Admitting: Family

## 2023-11-10 ENCOUNTER — Other Ambulatory Visit: Payer: Self-pay

## 2023-11-10 VITALS — BP 120/88 | HR 91 | Temp 97.3°F

## 2023-11-10 DIAGNOSIS — T7840XD Allergy, unspecified, subsequent encounter: Secondary | ICD-10-CM

## 2023-11-10 DIAGNOSIS — J302 Other seasonal allergic rhinitis: Secondary | ICD-10-CM | POA: Diagnosis not present

## 2023-11-10 DIAGNOSIS — J4540 Moderate persistent asthma, uncomplicated: Secondary | ICD-10-CM | POA: Diagnosis not present

## 2023-11-10 DIAGNOSIS — J3089 Other allergic rhinitis: Secondary | ICD-10-CM | POA: Diagnosis not present

## 2023-11-10 DIAGNOSIS — L272 Dermatitis due to ingested food: Secondary | ICD-10-CM

## 2023-11-10 MED ORDER — EPINEPHRINE 0.3 MG/0.3ML IJ SOAJ
0.3000 mg | INTRAMUSCULAR | 1 refills | Status: AC | PRN
  Filled 2023-11-10: qty 2, 30d supply, fill #0

## 2023-11-10 NOTE — Progress Notes (Signed)
 [address] [place] [address] Dept: [phone]  FOLLOW UP NOTE  Patient ID: Amanda Spence, female    DOB: 1970-05-19  Age: 53 y.o. MRN: [id number] Date of Office Visit: 11/10/2023  Assessment  Chief Complaint: Allergic Reaction (Unknown cause/Symptoms: upset stomach vomit itchy areas /States that  she use epipen x 2 weeks ago due to symptoms./Asthma no concerns.), Urticaria, and Pruritus  HPI Amanda Spence is a 53 year old female who presents today to discuss increasing allergic reactions.  She was last seen on August 06, 2022 by Dr. 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  Since her last office visit she has been diagnosed with stress fracture in her left foot.  She is currently wearing her boot.  Allergic reaction: She reports for the past year and a half out of the blue she will have a reaction.  It started with lower abdominal issues ,then genital area itching, nausea, vomiting, and scalp itching.  It is escalated in the past 1-1/2 years.  Now she has hives on her torso and upper thighs that are itchy.  She also recently had a little bit of wheezing.  A couple weeks ago when she was having a reaction and her face was numb and tingling and her tongue was enlarged and her lips were enlarged.  She reports that she did not have difficulty breathing.  She used her inhaler though.  She also took 2 Benadryl  because she felt it coming on, but this did not do anything.  She is avoiding sesame, cows milk, and pineapple.  She does however eat ice cream and cheese and does not have any reactions.  She does also eat mammalian meat without any problems.  Today she had beef and she had beef yesterday.  On Friday she had pork.  She does know that she has had a few dog ticks on her, but none of them have attached.  She has had 8-12 reactions in the past year and a half.  She has used her  EpiPen once, but did not go to the emergency room. She is not certain what is causing her reactions  Moderate persistent asthma: She reports that her asthma is under well control and that her primary care physician prescribes her Advair 100/50 mcg 1 puff twice a day inhaler.  She has not really had cough, wheeze, tightness in chest, or nocturnal awakenings due to breathing problems.  She does have some shortness of breath when she exercises too much.  Since her last office visit she has not required any systemic steroids or made any trips to the emergency room or urgent care due to breathing problems.  She only uses her albuterol  during reactions or sometimes in the fall in the spring.  Seasonal and perennial allergic rhinitis: She reports that she will have allergy  symptoms during the spring and fall but Flonasea nd Claritin will help.  She has not been treated for any sinus infections since we last saw her.   Drug Allergies:  Allergies  Allergen Reactions   Bee Venom Hives, Itching, Other (See Comments), Rash and Swelling   Biaxin [Clarithromycin] Nausea And Vomiting    Metal taste in mouth   Pineapple Other (See Comments)    Mouth sores   Sesame Seed (Diagnostic) Nausea Only    Review of Systems: Negative except as per HPI  Physical Exam: BP 120/88   Pulse 91  Temp (!) 97.3 F (36.3 C)   LMP 12/06/2022   SpO2 95%    Physical Exam Constitutional:      Appearance: Normal appearance.  HENT:     Head: Normocephalic and atraumatic.     Comments: Pharynx normal, eyes normal, ears normal, nose normal    Right Ear: Tympanic membrane, ear canal and external ear normal.     Left Ear: Tympanic membrane, ear canal and external ear normal.     Nose: Nose normal.     Mouth/Throat:     Mouth: Mucous membranes are moist.     Pharynx: Oropharynx is clear.  Eyes:     Conjunctiva/sclera: Conjunctivae normal.  Cardiovascular:     Rate and Rhythm: Regular rhythm.     Heart sounds: Normal  heart sounds.  Pulmonary:     Effort: Pulmonary effort is normal.     Breath sounds: Normal breath sounds.     Comments: Lungs clear to auscultation Musculoskeletal:     Cervical back: Neck supple.  Skin:    General: Skin is warm.  Neurological:     Mental Status: She is alert and oriented to person, place, and time.  Psychiatric:        Mood and Affect: Mood normal.        Behavior: Behavior normal.        Thought Content: Thought content normal.        Judgment: Judgment normal.     Diagnostics: FVC 2.53 L (73%), FEV1 2.04 L (75%), FEV1/FVC 0.81.  Spirometry indicates possible restrictive defect.  Assessment and Plan: 1. Allergic reaction, subsequent encounter   2. Moderate persistent asthma, uncomplicated   3. 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 Seasonal and perennial allergic rhinitis     Meds ordered this encounter  Medications   EPINEPHrine 0.3 mg/0.3 mL IJ SOAJ injection    Sig: Inject 0.3 mg into the muscle as needed for anaphylaxis.    Dispense:  2 each    Refill:  1    Patient Instructions  1. Allergic reaction - unknown trigger - Testing on 08/05/22 was slightly reactive to sesame, casein (component of cows milk), and pineapple. - However, none of these was particularly large. - I would avoid sesame, cows milk and all dairy products, and pineapple for now. - Stinging insect panel with low positive to fire  ant and honey bee. Avoid stinging insects and have access to epinephrine auto injector device - tryptase normal (3.2) and alpha gal normal - Have access to EpiPen at all times. - Follow anaphylaxis management - will get some additional lab work. We will call you with results once they are all back - Consider Xolair. Information given - Verbally discussed keeping a journal of foods eaten and medications eaten around 4 to 6 hours before reaction occurs  3. Moderate persistent asthma, uncomplicated - Daily controller medication(s): Advair 250/42mcg  one puff twice daily - Prior to physical activity: albuterol  2 puffs 10-15 minutes before physical activity. - Rescue medications: albuterol  4 puffs every 4-6 hours as needed - Asthma control goals:  * Full participation in all desired activities (may need albuterol  before activity) * Albuterol  use two time or less a week on average (not counting use with activity) * Cough interfering with sleep two time or less a month * Oral steroids no more than once a year * No hospitalizations  4. Seasonal and perennial allergic rhinitis - Testing on 08/05/22 showed: grasses, trees, indoor molds, cat,  dog, cockroach, and horse - Continue voidance measures as below - Continue: Claritin 10mg  tablet once daily and Flonase (fluticasone) one spray per nostril daily (AIM FOR EAR ON EACH SIDE) - You can use an extra dose of the antihistamine, if needed, for breakthrough symptoms.  - Consider nasal saline rinses 1-2 times daily to remove allergens from the nasal cavities as well as help with mucous clearance (this is especially helpful to do before the nasal sprays are given) - Consider allergy  shots as a means of long-term control. - Allergy  shots re-train and reset the immune system to ignore environmental allergens and decrease the resulting immune response to those allergens (sneezing, itchy watery eyes, runny nose, nasal congestion, etc).    - Allergy  shots improve symptoms in 75-85% of patients.  - We can discuss more at the next appointment if the medications are not working for you.  5. Follow up in 4 weeks with Dr. Iva or sooner if needed    Please inform us  of any Emergency Department visits, hospitalizations, or changes in symptoms. Call us  before going to the ED for breathing or allergy  symptoms since we might be able to fit you in for a sick visit. Feel free to contact us  anytime with any questions, problems, or concerns.  It was a pleasure to meet you today!  Websites that have  reliable patient information: 1. American Academy of Asthma, Allergy , and Immunology: www.aaaai.org 2. Food Allergy  Research and Education (FARE): foodallergy.org 3. Mothers of Asthmatics: http://www.asthmacommunitynetwork.org 4. [employer] of Allergy , Asthma, and Immunology: www.acaai.org       Reducing Pollen Exposure  The American Academy of Allergy , Asthma and Immunology suggests the following steps to reduce your exposure to pollen during allergy  seasons.    Do not hang sheets or clothing out to dry; pollen may collect on these items. Do not mow lawns or spend time around freshly cut grass; mowing stirs up pollen. Keep windows closed at night.  Keep car windows closed while driving. Minimize morning activities outdoors, a time when pollen counts are usually at their highest. Stay indoors as much as possible when pollen counts or humidity is high and on windy days when pollen tends to remain in the air longer. Use air conditioning when possible.  Many air conditioners have filters that trap the pollen spores. Use a HEPA room air filter to remove pollen form the indoor air you breathe.  Control of Mold Allergen   Mold and fungi can grow on a variety of surfaces provided certain temperature and moisture conditions exist.  Outdoor molds grow on plants, decaying vegetation and soil.  The major outdoor mold, Alternaria and Cladosporium, are found in very high numbers during hot and dry conditions.  Generally, a late Summer - Fall peak is seen for common outdoor fungal spores.  Rain will temporarily lower outdoor mold spore count, but counts rise rapidly when the rainy period ends.  The most important indoor molds are Aspergillus and Penicillium.  Dark, humid and poorly ventilated basements are ideal sites for mold growth.  The next most common sites of mold growth are the bathroom and the kitchen.  Indoor (Perennial) Mold Control   Positive indoor molds via skin testing:  Fusarium  Maintain humidity below 50%. Clean washable surfaces with 5% bleach solution. Remove sources e.g. contaminated carpets.   Control of Dog or Cat Allergen  Avoidance is the best way to manage a dog or cat allergy . If you have a dog or cat and are  allergic to dog or cats, consider removing the dog or cat from the home. If you have a dog or cat but don't want to find it a new home, or if your family wants a pet even though someone in the household is allergic, here are some strategies that may help keep symptoms at bay:  Keep the pet out of your bedroom and restrict it to only a few rooms. Be advised that keeping the dog or cat in only one room will not limit the allergens to that room. Don't pet, hug or kiss the dog or cat; if you do, wash your hands with soap and water . High-efficiency particulate air (HEPA) cleaners run continuously in a bedroom or living room can reduce allergen levels over time. Regular use of a high-efficiency vacuum cleaner or a central vacuum can reduce allergen levels. Giving your dog or cat a bath at least once a week can reduce airborne allergen.  Control of Cockroach Allergen  Cockroach allergen has been identified as an important cause of acute attacks of asthma, especially in urban settings.  There are fifty-five species of cockroach that exist in the [place] , however only three, the American, German and Oriental species produce allergen that can affect patients with Asthma.  Allergens can be obtained from fecal particles, egg casings and secretions from cockroaches.    Remove food sources. Reduce access to water . Seal access and entry points. Spray runways with 0.5-1% Diazinon or Chlorpyrifos Blow boric acid power under stoves and refrigerator. Place bait stations (hydramethylnon) at feeding sites.  Allergy  Shots  Allergies are the result of a chain reaction that starts in the immune system. Your immune system controls how your body defends  itself. For instance, if you have an allergy  to pollen, your immune system identifies pollen as an invader or allergen. Your immune system overreacts by producing antibodies called Immunoglobulin E (IgE). These antibodies travel to cells that release chemicals, causing an allergic reaction.  The concept behind allergy  immunotherapy, whether it is received in the form of shots or tablets, is that the immune system can be desensitized to specific allergens that trigger allergy  symptoms. Although it requires time and patience, the payback can be long-term relief. Allergy  injections contain a dilute solution of those substances that you are allergic to based upon your skin testing and allergy  history.   How Do Allergy  Shots Work?  Allergy  shots work much like a vaccine. Your body responds to injected amounts of a particular allergen given in increasing doses, eventually developing a resistance and tolerance to it. Allergy  shots can lead to decreased, minimal or no allergy  symptoms.  There generally are two phases: build-up and maintenance. Build-up often ranges from three to six months and involves receiving injections with increasing amounts of the allergens. The shots are typically given once or twice a week, though more rapid build-up schedules are sometimes used.  The maintenance phase begins when the most effective dose is reached. This dose is different for each person, depending on how allergic you are and your response to the build-up injections. Once the maintenance dose is reached, there are longer periods between injections, typically two to four weeks.  Occasionally doctors give cortisone-type shots that can temporarily reduce allergy  symptoms. These types of shots are different and should not be confused with allergy  immunotherapy shots.  Who Can Be Treated with Allergy  Shots?  Allergy  shots may be a good treatment approach for people with allergic rhinitis (hay fever), allergic asthma,  conjunctivitis (eye  allergy ) or stinging insect allergy .   Before deciding to begin allergy  shots, you should consider:   The length of allergy  season and the severity of your symptoms  Whether medications and/or changes to your environment can control your symptoms  Your desire to avoid long-term medication use  Time: allergy  immunotherapy requires a major time commitment  Cost: may vary depending on your insurance coverage  Allergy  shots for children age 18 and older are effective and often well tolerated. They might prevent the onset of new allergen sensitivities or the progression to asthma.  Allergy  shots are not started on patients who are pregnant but can be continued on patients who become pregnant while receiving them. In some patients with other medical conditions or who take certain common medications, allergy  shots may be of risk. It is important to mention other medications you talk to your allergist.   What are the two types of build-ups offered:   RUSH or Rapid Desensitization -- one day of injections lasting from 8:30-4:30pm, injections every 1 hour.  Approximately half of the build-up process is completed in that one day.  The following week, normal build-up is resumed, and this entails ~16 visits either weekly or twice weekly, until reaching your "maintenance dose" which is continued weekly until eventually getting spaced out to every month for a duration of 3 to 5 years. The regular build-up appointments are nurse visits where the injections are administered, followed by required monitoring for 30 minutes.    Traditional build-up -- weekly visits for 6 -12 months until reaching "maintenance dose", then continue weekly until eventually spacing out to every 4 weeks as above. At these appointments, the injections are administered, followed by required monitoring for 30 minutes.     Either way is acceptable, and both are equally effective. With the rush protocol, the advantage  is that less time is spent here for injections overall AND you would also reach maintenance dosing faster (which is when the clinical benefit starts to become more apparent). Not everyone is a candidate for rapid desensitization.   IF we proceed with the RUSH protocol, there are premedications which must be taken the day before and the day after the rush only (this includes antihistamines, steroids, and Singulair).  After the rush day, no prednisone or Singulair is required, and we just recommend antihistamines taken on your injection day.  What Is An Estimate of the Costs?  If you are interested in starting allergy  injections, please check with your insurance company about your coverage for both allergy  vial sets and allergy  injections.  Please do so prior to making the appointment to start injections.  The following are CPT codes to give to your insurance company. These are the amounts we BILL to the insurance company, but the amount YOU WILL PAY and WE RECEIVE IS SUBSTANTIALLY LESS and depends on the contracts we have with different insurance companies.   Amount Billed to Insurance One allergy  vial set  CPT 95165   $ 1200     Two allergy  vial set  CPT 95165   $ 2400     Three allergy  vial set  CPT 95165   $ 3600     One injection   CPT 95115   $ 35  Two injections   CPT 95117   $ 40 RUSH (Rapid Desensitization) CPT 95180 x 8 hours $500/hour  Regarding the allergy  injections, your co-pay may or may not apply with each injection, so please confirm this with your insurance  company. When you start allergy  injections, 1 or 2 sets of vials are made based on your allergies.  Not all patients can be on one set of vials. A set of vials lasts 6 months to a year depending on how quickly you can proceed with your build-up of your allergy  injections. Vials are personalized for each patient depending on their specific allergens.  How often are allergy  injection given during the build-up period?    Injections are given at least weekly during the build-up period until your maintenance dose is achieved. Per the doctor's discretion, you may have the option of getting allergy  injections two times per week during the build-up period. However, there must be at least 48 hours between injections. The build-up period is usually completed within 6-12 months depending on your ability to schedule injections and for adjustments for reactions. When maintenance dose is reached, your injection schedule is gradually changed to every two weeks and later to every three weeks. Injections will then continue every 4 weeks. Usually, injections are continued for a total of 3-5 years.   When Will I Feel Better?  Some may experience decreased allergy  symptoms during the build-up phase. For others, it may take as long as 12 months on the maintenance dose. If there is no improvement after a year of maintenance, your allergist will discuss other treatment options with you.  If you aren't responding to allergy  shots, it may be because there is not enough dose of the allergen in your vaccine or there are missing allergens that were not identified during your allergy  testing. Other reasons could be that there are high levels of the allergen in your environment or major exposure to non-allergic triggers like tobacco smoke.  What Is the Length of Treatment?  Once the maintenance dose is reached, allergy  shots are generally continued for three to five years. The decision to stop should be discussed with your allergist at that time. Some people may experience a permanent reduction of allergy  symptoms. Others may relapse and a longer course of allergy  shots can be considered.  What Are the Possible Reactions?  The two types of adverse reactions that can occur with allergy  shots are local and systemic. Common local reactions include very mild redness and swelling at the injection site, which can happen immediately or several  hours after. Report a delayed reaction from your last injection. These include arm swelling or runny nose, watery eyes or cough that occurs within 12-24 hours after injection. A systemic reaction, which is less common, affects the entire body or a particular body system. They are usually mild and typically respond quickly to medications. Signs include increased allergy  symptoms such as sneezing, a stuffy nose or hives.   Rarely, a serious systemic reaction called anaphylaxis can develop. Symptoms include swelling in the throat, wheezing, a feeling of tightness in the chest, nausea or dizziness. Most serious systemic reactions develop within 30 minutes of allergy  shots. This is why it is strongly recommended you wait in your doctor's office for 30 minutes after your injections. Your allergist is trained to watch for reactions, and his or her staff is trained and equipped with the proper medications to identify and treat them.   Report to the nurse immediately if you experience any of the following symptoms: swelling, itching or redness of the skin, hives, watery eyes/nose, breathing difficulty, excessive sneezing, coughing, stomach pain, diarrhea, or light headedness. These symptoms may occur within 15-20 minutes after injection and may require medication.  Who Should Administer Allergy  Shots?  The preferred location for receiving shots is your prescribing allergist's office. Injections can sometimes be given at another facility where the physician and staff are trained to recognize and treat reactions, and have received instructions by your prescribing allergist.  What if I am late for an injection?   Injection dose will be adjusted depending upon how many days or weeks you are late for your injection.   What if I am sick?   Please report any illness to the nurse before receiving injections. She may adjust your dose or postpone injections depending on your symptoms. If you have fever, flu, sinus  infection or chest congestion it is best to postpone allergy  injections until you are better. Never get an allergy  injection if your asthma is causing you problems. If your symptoms persist, seek out medical care to get your health problem under control.  What If I am or Become Pregnant:  Women that become pregnant should schedule an appointment with [facility] before receiving any further allergy  injections.         Return in about 4 weeks (around 12/08/2023), or if symptoms worsen or fail to improve.    Thank you for the opportunity to care for this patient.  Please do not hesitate to contact me with questions.  Wanda Craze, FNP [facility] 

## 2023-11-11 NOTE — Addendum Note (Signed)
 Addended by: NANCEE JON SAILOR on: 11/11/2023 05:04 PM   Modules accepted: Orders

## 2023-11-13 LAB — TRYPTASE: Tryptase: 2.8 ug/L (ref 2.2–13.2)

## 2023-11-13 LAB — ALPHA-GAL PANEL
Allergen Lamb IgE: 0.17 kU/L — AB
Beef IgE: <0.1 kU/L
IgE (Immunoglobulin E), Serum: 595 [IU]/mL — ABNORMAL HIGH (ref 6–495)
O215-IgE Alpha-Gal: <0.1 kU/L
Pork IgE: <0.1 kU/L

## 2023-11-19 ENCOUNTER — Other Ambulatory Visit: Payer: Self-pay

## 2023-11-20 DIAGNOSIS — M1711 Unilateral primary osteoarthritis, right knee: Secondary | ICD-10-CM | POA: Diagnosis not present

## 2023-11-20 DIAGNOSIS — M79672 Pain in left foot: Secondary | ICD-10-CM | POA: Diagnosis not present

## 2023-11-20 DIAGNOSIS — M357 Hypermobility syndrome: Secondary | ICD-10-CM | POA: Diagnosis not present

## 2023-11-20 DIAGNOSIS — E1165 Type 2 diabetes mellitus with hyperglycemia: Secondary | ICD-10-CM | POA: Diagnosis not present

## 2023-11-20 DIAGNOSIS — Z794 Long term (current) use of insulin: Secondary | ICD-10-CM | POA: Diagnosis not present

## 2023-11-20 DIAGNOSIS — M25561 Pain in right knee: Secondary | ICD-10-CM | POA: Diagnosis not present

## 2023-11-23 ENCOUNTER — Ambulatory Visit: Payer: Self-pay | Admitting: Allergy & Immunology

## 2023-12-18 ENCOUNTER — Other Ambulatory Visit: Payer: Self-pay

## 2023-12-18 DIAGNOSIS — M1711 Unilateral primary osteoarthritis, right knee: Secondary | ICD-10-CM | POA: Diagnosis not present

## 2023-12-18 DIAGNOSIS — E119 Type 2 diabetes mellitus without complications: Secondary | ICD-10-CM | POA: Diagnosis not present

## 2023-12-18 DIAGNOSIS — M25561 Pain in right knee: Secondary | ICD-10-CM | POA: Diagnosis not present

## 2024-01-05 ENCOUNTER — Other Ambulatory Visit: Payer: Self-pay

## 2024-01-05 ENCOUNTER — Ambulatory Visit: Admitting: Allergy & Immunology

## 2024-01-05 ENCOUNTER — Encounter: Payer: Self-pay | Admitting: Allergy & Immunology

## 2024-01-05 VITALS — BP 112/72 | HR 76 | Temp 98.0°F | Resp 18 | Ht 62.0 in | Wt 282.2 lb

## 2024-01-05 DIAGNOSIS — J3089 Other allergic rhinitis: Secondary | ICD-10-CM | POA: Diagnosis not present

## 2024-01-05 DIAGNOSIS — T7840XD Allergy, unspecified, subsequent encounter: Secondary | ICD-10-CM

## 2024-01-05 DIAGNOSIS — J4540 Moderate persistent asthma, uncomplicated: Secondary | ICD-10-CM

## 2024-01-05 DIAGNOSIS — J302 Other seasonal allergic rhinitis: Secondary | ICD-10-CM

## 2024-01-05 DIAGNOSIS — L272 Dermatitis due to ingested food: Secondary | ICD-10-CM

## 2024-01-05 MED ORDER — LEVOCETIRIZINE DIHYDROCHLORIDE 5 MG PO TABS
5.0000 mg | ORAL_TABLET | Freq: Two times a day (BID) | ORAL | 1 refills | Status: AC
  Filled 2024-01-05: qty 180, 90d supply, fill #0

## 2024-01-05 NOTE — Addendum Note (Signed)
 Addended by: CELESTIA BIBBER D on: 01/05/2024 11:20 AM   Modules accepted: Orders

## 2024-01-05 NOTE — Progress Notes (Signed)
 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  We talked about using other means of controlling her urticaria, including Xolair or Rhapsido.  However, she has not been on suppressive doses of antihistamines so I do not think that would be approved at this point.  She is going to go ahead and get all of this stuff done and look into getting something else approved if needed in the future.  A diagnosis of mast cell activation syndrome has been proposed to diagnose patients with a variety of symptoms and laboratory studies possibly suggestive of mast cell activation without meeting full criteria for diagnosing systemic mastocytosis. This diagnosis remains somewhat controversial with the proposed diagnostic criteria continuing to evolve, but at present it may be reasonable to consider this diagnosis if the serum tryptase is found to be significantly elevated when the patient experiences a flare of symptoms, and then falls back to the patient's baseline level when symptoms recede to baseline. Included in the diagnostic criteria at present is also the existence of certain clinical symptoms which are significantly relieved with anti-mast cell mediator therapy (i.e. antihistamines - H1 H2 blockers, montelukast, etc.) Thus, if mast cell involvement remains in the differential diagnosis, it is recommended to obtain serial serum tryptase blood testing during flares and correlate them with patient symptoms.   We are going to test other mediators of mast cell activation, including plasma and urinary metabolites (prostaglandins PGD2, PGF2alpha, etc.). These have been also  proposed to be elevated with [facility], however there are issues with these tests that potentially confound the interpretation of the results and make these tests less reliable. Histamine levels are confounded by dietary ingestion of histamine-containing foods. In addition, histamine is not necessarily a reliable marker of histamine release and mast cell activation if a histamine-free diet has not been carefully followed beforehand.  In addition, the regular use of oral antihistamines might result in alterations of the levels.  Cessation of antihistamines before collecting these has been proposed, but oftentimes patients cannot tolerate being off of antihistamines due to their symptoms, leading to a risk versus benefit conundrum.   In addition, there are other sources of prostaglandin metabolites.The prostaglandins PGD2 and PGF2alpha - metabolites from PGH2-synthase/COX-1/2 activation and PGD2 synthase - can be synthesized by hematopoietic cells (such as histiocytes, Langerhans cells, dendritic cells, Th2 lymphocytes, as well as mast cells) and also non-hematopoietic cells (such as neurons and oligodendrocytes in the central nervous system, epithelial cells of the choroid plexus, Leydig cells in female genital organs, and endocardial and myocardial cells, amongst others). Therefore the detection of PGD2 or its metabolites is somewhat problematic in the evaluation of mast cell activation as numerous cell types can generate PGD2, in addition to mast cells. In fact, the detection of elevated PGD2 in the setting of a normal tryptase can implicate a non-mast cell source of PGD2, and thus does not necessarily add additional clarity when compared to the more mast cell-specific protein tryptase. Therefore, elevation of tryptase is the most specific test for [facility]AS.   We will order a standing order in order to check the tryptase serially within 4-6  hours of the onset of the patient's own flaring of symptoms.  This will help to  correlate any raises of tryptase with symptoms, triggers, and timing. This can be drawn at any lab or even an Emergency Room.  In the meantime, we are going to attempt to manage symptoms with a combination of histamine blockers and other medications to help alleviate this constellation of symptoms. Depending on the severity of the symptoms, we may even consider advancing to Xolair (omalizumab), which has been shown to decrease the incidence of mast cell related symptoms, including anaphylaxis. In addition, if you have noticed that there is a particular trigger for your symptoms, we would recommend avoiding that trigger if possible.    Plan/Recommendations:   1. Allergic reaction - unknown trigger - Tryptase and alpha gal panel were essentially normal.  - EpiPen  is up to date.  - Definitely try to take a levocetirizine one to two times daily.   - Tryptase lab requisition provided to collect when you have a reaction.  - Consider Rhapsido in the future (must try suppressive antihistamines first).   3. Moderate persistent asthma, uncomplicated - Lung testing looked great today.  - You seem to have everything under good control. - Daily controller medication(s): Advair 250/68mcg one puff twice daily - Prior to physical activity: albuterol  2 puffs 10-15 minutes before physical activity. - Rescue medications: albuterol  4 puffs every 4-6 hours as needed - Asthma control goals:  * Full participation in all desired activities (may need albuterol  before activity) * Albuterol  use two time or less a week on average (not counting use with activity) * Cough interfering with sleep two time or less a month * Oral steroids no more than once a year * No hospitalizations  4. Seasonal and perennial allergic rhinitis - Testing in the past has showed: grasses, trees, indoor molds, cat, dog, cockroach, and horse - Continue  taking: Xyzal  (levocetirizine) 5mg  tablet once daily and Flonase (fluticasone) one spray  per nostril daily (AIM FOR EAR ON EACH SIDE) - You can use an extra dose of the antihistamine, if needed, for breakthrough symptoms.  - Consider nasal saline rinses 1-2 times daily to remove allergens from the nasal cavities as well as help with mucous clearance (this is especially helpful to do before the nasal sprays are given)  5. Return in about 3 months (around 04/04/2024). You can have the follow up appointment with Dr. Iva or a Nurse Practicioner (our Nurse Practitioners are excellent and always have Physician oversight!).   Subjective:   Amanda Spence is a 53 y.o. female presenting today for follow up of  Chief Complaint  Patient presents with   Follow-up    Patient has had some two food reactions with hives, lip swelling, face, nausea, vomiting.     Amanda Spence has a history of the following: Patient Active Problem List   Diagnosis Date Noted   Endometrial cancer (HCC) 12/17/2022   Moderate persistent asthma, uncomplicated 08/06/2022   Seasonal and perennial allergic rhinitis 08/06/2022   Allergic reaction 08/06/2022    History obtained from: chart review and patient.  Discussed the use of AI scribe software for clinical note transcription with the patient and/or guardian, who gave verbal consent to proceed.  Amanda Spence is a 53 y.o. female presenting for a follow up visit.  She was last seen in October 2025 by Wanda Craze.  At that time, she was continued on Advair 250/50 micrograms 1 puff twice daily and albuterol  as  needed.  For her allergic rhinitis, she was continued on Claritin as well as Flonase.  Since last visit, she has largely done well.  Asthma/Respiratory Symptom History: She uses Advair for breathing issues and has an up-to-date EpiPen . She states her breathing is pretty good. Aikam's asthma has been well controlled. She has not required rescue medication, experienced nocturnal awakenings due to lower respiratory symptoms, nor have activities  of daily living been limited. She has required no Emergency Department or Urgent Care visits for her asthma. She has required zero courses of systemic steroids for asthma exacerbations since the last visit. ACT score today is 25, indicating excellent asthma symptom control.   Allergic Rhinitis Symptom History: Allergic rhinitis has been under good control with Xyzal  as needed.  For the most part, she does not have a lot of environmental allergy  symptoms.  Food Allergy  Symptom History: She experiences allergic reactions to certain foods, including Japanese food and grilled chicken with cilantro pesto. Symptoms include nausea, vomiting, diarrhea, hives, itching, and facial swelling, but no trouble breathing. She has avoided milk but consumes cheese without issues. She suspects preservatives, MSG, or garlic as potential triggers. She has kept a food diary, but finds it inconclusive. She buys organic milk and farm-raised eggs to avoid preservatives, which she believes may be causing her symptoms. Her family is concerned about her reactions, especially with upcoming meals like lasagna for Christmas Eve.  She has had two allergic reactions since her last visit, occurring within six days of each other, but has been careful with her diet since then. She is not on daily antihistamines but uses Benadryl  or Zyrtec as needed.  Previous allergy  tests showed negative results for alpha-gal and normal tryptase levels. Lamb and fire  ants were barely positive, and honey bee was not significant. Sesame showed mild reactivity, but avoiding it has not helped. She has been cautious with her diet, avoiding foods that might trigger reactions.   Skin Symptom History: She has a history of hives affecting her torso, upper legs, neck, and face.  However, she has not been taking levocetirizine twice a day.  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 She has regular hours, allowing her to attend  classes.   Otherwise, there have been no changes to her past medical history, surgical history, family history, or social history.    Review of systems otherwise negative other than that mentioned in the HPI.    Objective:   Blood pressure 112/72, pulse 76, temperature 98 F (36.7 C), temperature source Temporal, resp. rate 18, height 5' 2 (1.575 m), weight 282 lb 3.2 oz (128 kg), last menstrual period 12/06/2022, SpO2 98%. Body mass index is 51.62 kg/m.    Physical Exam Vitals reviewed.  Constitutional:      Appearance: She is well-developed.     Comments: Pleasant.  HENT:     Head: Normocephalic and atraumatic.     Right Ear: Tympanic membrane, ear canal and external ear normal. No drainage, swelling or tenderness. Tympanic membrane is not injected, scarred, erythematous, retracted or bulging.     Left Ear: Tympanic membrane, ear canal and external ear normal. No drainage, swelling or tenderness. Tympanic membrane is not injected, scarred, erythematous, retracted or bulging.     Nose: No nasal deformity, septal deviation, mucosal edema or rhinorrhea.     Right Turbinates: Enlarged, swollen and pale.     Left Turbinates: Enlarged, swollen and pale.     Right Sinus: No maxillary  sinus tenderness or frontal sinus tenderness.     Left Sinus: No maxillary sinus tenderness or frontal sinus tenderness.     Comments: No polyps.    Mouth/Throat:     Mouth: Mucous membranes are not pale and not dry.     Pharynx: Uvula midline.  Eyes:     General:        Right eye: No discharge.        Left eye: No discharge.     Conjunctiva/sclera: Conjunctivae normal.     Right eye: Right conjunctiva is not injected. No chemosis.    Left eye: Left conjunctiva is not injected. No chemosis.    Pupils: Pupils are equal, round, and reactive to light.  Cardiovascular:     Rate and Rhythm: Normal rate and regular rhythm.     Heart sounds: Normal heart sounds.  Pulmonary:     Effort: Pulmonary  effort is normal. No tachypnea, accessory muscle usage or respiratory distress.     Breath sounds: Normal breath sounds. No wheezing, rhonchi or rales.     Comments: Moving air well in all lung fields.  No increased work of breathing. Chest:     Chest wall: No tenderness.  Abdominal:     Tenderness: There is no abdominal tenderness. There is no guarding or rebound.  Lymphadenopathy:     Head:     Right side of head: No submandibular, tonsillar or occipital adenopathy.     Left side of head: No submandibular, tonsillar or occipital adenopathy.     Cervical: No cervical adenopathy.  Skin:    Coloration: Skin is not pale.     Findings: No abrasion, erythema, petechiae or rash. Rash is not papular, urticarial or vesicular.  Neurological:     Mental Status: She is alert.  Psychiatric:        Behavior: Behavior is cooperative.      Diagnostic studies:    Spirometry: results normal (FEV1: 2.31/86%, FVC: 2.79/82%, FEV1/FVC: 83%).    Spirometry consistent with normal pattern.   Allergy  Studies: none       Marty Shaggy, MD  [facility]        "

## 2024-01-05 NOTE — Patient Instructions (Addendum)
 1. Allergic reaction - unknown trigger - Tryptase and alpha gal panel were essentially normal.  - EpiPen  is up to date.  - Definitely try to take a levocetirizine one to two times daily.   - Tryptase lab requisition provided to collect when you have a reaction.  - Consider Rhapsido in the future (must try suppressive antihistamines first).   3. Moderate persistent asthma, uncomplicated - Lung testing looked great today.  - You seem to have everything under good control. - Daily controller medication(s): Advair 250/18mcg one puff twice daily - Prior to physical activity: albuterol  2 puffs 10-15 minutes before physical activity. - Rescue medications: albuterol  4 puffs every 4-6 hours as needed - Asthma control goals:  * Full participation in all desired activities (may need albuterol  before activity) * Albuterol  use two time or less a week on average (not counting use with activity) * Cough interfering with sleep two time or less a month * Oral steroids no more than once a year * No hospitalizations  4. Seasonal and perennial allergic rhinitis - Testing in the past has showed: grasses, trees, indoor molds, cat, dog, cockroach, and horse - Continue  taking: Xyzal  (levocetirizine) 5mg  tablet once daily and Flonase (fluticasone) one spray per nostril daily (AIM FOR EAR ON EACH SIDE) - You can use an extra dose of the antihistamine, if needed, for breakthrough symptoms.  - Consider nasal saline rinses 1-2 times daily to remove allergens from the nasal cavities as well as help with mucous clearance (this is especially helpful to do before the nasal sprays are given)  5. Return in about 3 months (around 04/04/2024). You can have the follow up appointment with Dr. Iva or a Nurse Practicioner (our Nurse Practitioners are excellent and always have Physician oversight!).    Please inform us  of any Emergency Department visits, hospitalizations, or changes in symptoms. Call us  before going to the  ED for breathing or allergy  symptoms since we might be able to fit you in for a sick visit. Feel free to contact us  anytime with any questions, problems, or concerns.  It was a pleasure to see you again today!  Websites that have reliable patient information: 1. American Academy of Asthma, Allergy , and Immunology: www.aaaai.org 2. Food Allergy  Research and Education (FARE): foodallergy.org 3. Mothers of Asthmatics: http://www.asthmacommunitynetwork.org 4. American College of Allergy , Asthma, and Immunology: www.acaai.org      Like us  on [employer] and Instagram for our latest updates!      A healthy democracy works best when [employer] participate! Make sure you are registered to vote! If you have moved or changed any of your contact information, you will need to get this updated before voting! Scan the QR codes below to learn more!

## 2024-01-06 ENCOUNTER — Other Ambulatory Visit: Payer: Self-pay

## 2024-01-16 LAB — ALLERGEN, GARLIC, F47: Allergen Garlic IgE: 0.2 kU/L — AB

## 2024-01-16 LAB — F297-IGE ACACIA GUM: F297-IgE Acacia Gum: 0.12 kU/L — AB

## 2024-01-16 LAB — PROSTAGLANDIN D2, SERUM: Prostaglandin D2, serum: 5.2 pg/mL

## 2024-01-20 ENCOUNTER — Ambulatory Visit: Payer: Self-pay | Admitting: Allergy & Immunology

## 2024-01-22 ENCOUNTER — Other Ambulatory Visit: Payer: Self-pay

## 2024-01-22 ENCOUNTER — Telehealth: Payer: Self-pay | Admitting: Pharmacist

## 2024-01-22 MED ORDER — LISINOPRIL 10 MG PO TABS
5.0000 mg | ORAL_TABLET | Freq: Every day | ORAL | 1 refills | Status: AC
  Filled 2024-01-22: qty 45, 90d supply, fill #0

## 2024-01-22 MED ORDER — ROSUVASTATIN CALCIUM 10 MG PO TABS
10.0000 mg | ORAL_TABLET | Freq: Every day | ORAL | 0 refills | Status: AC
  Filled 2024-01-22: qty 90, 90d supply, fill #0

## 2024-01-22 MED ORDER — ESCITALOPRAM OXALATE 20 MG PO TABS: 20.0000 mg | ORAL_TABLET | Freq: Every day | ORAL | 1 refills | Status: AC

## 2024-01-22 MED ORDER — BUSPIRONE HCL 7.5 MG PO TABS
7.5000 mg | ORAL_TABLET | Freq: Two times a day (BID) | ORAL | 1 refills | Status: AC
  Filled 2024-01-22: qty 180, 90d supply, fill #0

## 2024-01-25 ENCOUNTER — Other Ambulatory Visit: Payer: Self-pay

## 2024-01-25 MED ORDER — HUMULIN 70/30 KWIKPEN (70-30) 100 UNIT/ML ~~LOC~~ SUPN
PEN_INJECTOR | SUBCUTANEOUS | 0 refills | Status: AC
  Filled 2024-01-25: qty 30, 30d supply, fill #0

## 2024-01-26 ENCOUNTER — Other Ambulatory Visit: Payer: Self-pay

## 2024-01-27 NOTE — Progress Notes (Signed)
 Was sold on 01/26/24 for full 30mL for $35 total.  Will follow-up with the patient on Friday 1/16 to get readings and adjust for the weekend.

## 2024-01-27 NOTE — Progress Notes (Signed)
" ° °  01/27/2024  Patient ID: Suzen LITTIE Fire, female   DOB: 16-Sep-1970, 54 y.o.   MRN: [id number]  Received referral message from Dr. Ricky asking about insulin  coverage that is affordable and a care plan.  She is having affordability issue with Tresiba  so she only takes it every few days at 50 units daily. A1C very uncontrolled at 11.8% last October.  Reports violent vomiting with Ozempic trial prior. Would like to avoid GLP-1's if possible.  Formulary review: Preferred insulins: Tresiba , Toujeo, insulin  lispro, Humalog (U-200), Lyumjev  Humulin  or Novolin 70/30 would be $30 for 30DS per test claim Toujeo + Tresiba  U-200 are $125 Generic lispro is $5  Freestyle Libre= $75 (no Dexcom coverage)  $25 for [facility]- says preferred brand follows 20% coinsurance with $30 minimum (can still send and see)  Plan: - STOP Tresiba  and START Humulin  70/30 - Will inject 18 units twice a day 30 minutes prior to breakfast and dinner. Titrate as directed by PCP. Max dose of 100U per day. - Start Libre device on day of Humulin  start and call the office 3 days later to discuss readings    Aloysius Lewis, PharmD, [facility] Phone Number: [phone]  "

## 2024-01-29 ENCOUNTER — Telehealth: Payer: Self-pay | Admitting: Pharmacist

## 2024-01-29 NOTE — Progress Notes (Signed)
" ° °  01/29/2024  Patient ID: Amanda Spence, female   DOB: 07/28/1970, 54 y.o.   MRN: [id number]  Called and spoke with the patient on the phone today. Said that she was able to pick-up the new insulin , but hasn't started it yet. Will start the regimen and Libre sometime this weekend. Advised I will call back on Wednesday for follow-up readings.  Requested to message her through [facility] instead of calling or MyChart as this would be easiest for communication. Will be working on only gets a 30-minute lunch break. Confirmed understanding- will send a [place] message on Wednesday to see how insulin  changes are doing and to get [place] account attached for viewing.    Aloysius Lewis, PharmD, [facility] Phone Number: [phone]  "

## 2024-02-10 ENCOUNTER — Telehealth: Payer: Self-pay | Admitting: Pharmacist

## 2024-02-10 NOTE — Progress Notes (Signed)
" ° °  02/10/2024  Patient ID: Amanda Spence, female   DOB: August 20, 1970, 54 y.o.   MRN: [id number]  MyChart message sent for another way to attempt to reach the patient for DM check-in.   Aloysius Lewis, PharmD, [facility]  [facility] Phone Number: [phone]  "

## 2024-02-11 ENCOUNTER — Other Ambulatory Visit: Payer: Self-pay

## 2024-02-11 MED ORDER — GLIPIZIDE ER 5 MG PO TB24
5.0000 mg | ORAL_TABLET | Freq: Every morning | ORAL | 1 refills | Status: AC
  Filled 2024-02-11: qty 90, 90d supply, fill #0

## 2024-04-05 ENCOUNTER — Ambulatory Visit: Admitting: Allergy & Immunology
